# Patient Record
Sex: Male | Born: 1935 | Race: White | Hispanic: No | Smoking: Never smoker
Health system: Southern US, Community
[De-identification: ages and names within clinical notes are randomized; demographics above are authoritative.]

## PROBLEM LIST (undated history)

## (undated) DIAGNOSIS — E785 Hyperlipidemia, unspecified: Secondary | ICD-10-CM

## (undated) DIAGNOSIS — M949 Disorder of cartilage, unspecified: Secondary | ICD-10-CM

## (undated) DIAGNOSIS — R197 Diarrhea, unspecified: Secondary | ICD-10-CM

## (undated) DIAGNOSIS — R4182 Altered mental status, unspecified: Secondary | ICD-10-CM

## (undated) DIAGNOSIS — M2550 Pain in unspecified joint: Secondary | ICD-10-CM

## (undated) DIAGNOSIS — E559 Vitamin D deficiency, unspecified: Secondary | ICD-10-CM

## (undated) DIAGNOSIS — G309 Alzheimer's disease, unspecified: Secondary | ICD-10-CM

## (undated) DIAGNOSIS — R259 Unspecified abnormal involuntary movements: Secondary | ICD-10-CM

## (undated) DIAGNOSIS — M810 Age-related osteoporosis without current pathological fracture: Secondary | ICD-10-CM

## (undated) DIAGNOSIS — E1165 Type 2 diabetes mellitus with hyperglycemia: Secondary | ICD-10-CM

## (undated) DIAGNOSIS — F411 Generalized anxiety disorder: Secondary | ICD-10-CM

## (undated) DIAGNOSIS — R269 Unspecified abnormalities of gait and mobility: Secondary | ICD-10-CM

## (undated) DIAGNOSIS — R42 Dizziness and giddiness: Secondary | ICD-10-CM

## (undated) DIAGNOSIS — H6120 Impacted cerumen, unspecified ear: Secondary | ICD-10-CM

## (undated) DIAGNOSIS — R3129 Other microscopic hematuria: Secondary | ICD-10-CM

## (undated) DIAGNOSIS — R5383 Other fatigue: Secondary | ICD-10-CM

## (undated) DIAGNOSIS — M1990 Unspecified osteoarthritis, unspecified site: Secondary | ICD-10-CM

## (undated) DIAGNOSIS — R350 Frequency of micturition: Secondary | ICD-10-CM

## (undated) DIAGNOSIS — R5381 Other malaise: Secondary | ICD-10-CM

## (undated) DIAGNOSIS — G252 Other specified forms of tremor: Secondary | ICD-10-CM

## (undated) DIAGNOSIS — L2089 Other atopic dermatitis: Secondary | ICD-10-CM

## (undated) DIAGNOSIS — R3915 Urgency of urination: Secondary | ICD-10-CM

## (undated) DIAGNOSIS — K5641 Fecal impaction: Secondary | ICD-10-CM

## (undated) DIAGNOSIS — F0280 Dementia in other diseases classified elsewhere without behavioral disturbance: Secondary | ICD-10-CM

## (undated) DIAGNOSIS — R32 Unspecified urinary incontinence: Secondary | ICD-10-CM

## (undated) DIAGNOSIS — D126 Benign neoplasm of colon, unspecified: Secondary | ICD-10-CM

## (undated) DIAGNOSIS — F3289 Other specified depressive episodes: Secondary | ICD-10-CM

## (undated) DIAGNOSIS — N401 Enlarged prostate with lower urinary tract symptoms: Secondary | ICD-10-CM

## (undated) DIAGNOSIS — IMO0001 Reserved for inherently not codable concepts without codable children: Secondary | ICD-10-CM

## (undated) DIAGNOSIS — G250 Essential tremor: Secondary | ICD-10-CM

## (undated) DIAGNOSIS — Z9181 History of falling: Secondary | ICD-10-CM

## (undated) DIAGNOSIS — G20A1 Parkinson's disease without dyskinesia, without mention of fluctuations: Secondary | ICD-10-CM

## (undated) DIAGNOSIS — G4700 Insomnia, unspecified: Secondary | ICD-10-CM

## (undated) DIAGNOSIS — M25569 Pain in unspecified knee: Secondary | ICD-10-CM

## (undated) DIAGNOSIS — R1310 Dysphagia, unspecified: Secondary | ICD-10-CM

## (undated) DIAGNOSIS — M899 Disorder of bone, unspecified: Secondary | ICD-10-CM

## (undated) DIAGNOSIS — N138 Other obstructive and reflux uropathy: Secondary | ICD-10-CM

## (undated) DIAGNOSIS — G20 Parkinson's disease: Secondary | ICD-10-CM

## (undated) DIAGNOSIS — F329 Major depressive disorder, single episode, unspecified: Secondary | ICD-10-CM

## (undated) DIAGNOSIS — Z125 Encounter for screening for malignant neoplasm of prostate: Secondary | ICD-10-CM

## (undated) DIAGNOSIS — D518 Other vitamin B12 deficiency anemias: Secondary | ICD-10-CM

## (undated) DIAGNOSIS — N23 Unspecified renal colic: Secondary | ICD-10-CM

## (undated) DIAGNOSIS — M069 Rheumatoid arthritis, unspecified: Secondary | ICD-10-CM

## (undated) HISTORY — DX: Other specified depressive episodes: F32.89

## (undated) HISTORY — DX: Other atopic dermatitis: L20.89

## (undated) HISTORY — DX: Diarrhea, unspecified: R19.7

## (undated) HISTORY — DX: Urgency of urination: R39.15

## (undated) HISTORY — DX: Hyperlipidemia, unspecified: E78.5

## (undated) HISTORY — DX: Impacted cerumen, unspecified ear: H61.20

## (undated) HISTORY — PX: APPENDECTOMY: SHX54

## (undated) HISTORY — DX: Benign prostatic hyperplasia with lower urinary tract symptoms: N40.1

## (undated) HISTORY — DX: Disorder of cartilage, unspecified: M94.9

## (undated) HISTORY — DX: History of falling: Z91.81

## (undated) HISTORY — DX: Dizziness and giddiness: R42

## (undated) HISTORY — DX: Encounter for screening for malignant neoplasm of prostate: Z12.5

## (undated) HISTORY — DX: Unspecified abnormalities of gait and mobility: R26.9

## (undated) HISTORY — DX: Other vitamin B12 deficiency anemias: D51.8

## (undated) HISTORY — DX: Other obstructive and reflux uropathy: N13.8

## (undated) HISTORY — DX: Other malaise: R53.81

## (undated) HISTORY — DX: Benign neoplasm of colon, unspecified: D12.6

## (undated) HISTORY — DX: Essential tremor: G25.0

## (undated) HISTORY — DX: Insomnia, unspecified: G47.00

## (undated) HISTORY — DX: Vitamin D deficiency, unspecified: E55.9

## (undated) HISTORY — DX: Unspecified abnormal involuntary movements: R25.9

## (undated) HISTORY — DX: Age-related osteoporosis without current pathological fracture: M81.0

## (undated) HISTORY — DX: Parkinson's disease without dyskinesia, without mention of fluctuations: G20.A1

## (undated) HISTORY — DX: Parkinson's disease: G20

## (undated) HISTORY — DX: Dementia in other diseases classified elsewhere, unspecified severity, without behavioral disturbance, psychotic disturbance, mood disturbance, and anxiety: F02.80

## (undated) HISTORY — DX: Reserved for inherently not codable concepts without codable children: IMO0001

## (undated) HISTORY — DX: Pain in unspecified knee: M25.569

## (undated) HISTORY — DX: Type 2 diabetes mellitus with hyperglycemia: E11.65

## (undated) HISTORY — DX: Other fatigue: R53.83

## (undated) HISTORY — DX: Generalized anxiety disorder: F41.1

## (undated) HISTORY — DX: Other microscopic hematuria: R31.29

## (undated) HISTORY — DX: Fecal impaction: K56.41

## (undated) HISTORY — DX: Disorder of bone, unspecified: M89.9

## (undated) HISTORY — DX: Unspecified urinary incontinence: R32

## (undated) HISTORY — DX: Altered mental status, unspecified: R41.82

## (undated) HISTORY — DX: Pain in unspecified joint: M25.50

## (undated) HISTORY — DX: Unspecified renal colic: N23

## (undated) HISTORY — DX: Essential tremor: G25.2

## (undated) HISTORY — DX: Frequency of micturition: R35.0

## (undated) HISTORY — DX: Dysphagia, unspecified: R13.10

## (undated) HISTORY — DX: Major depressive disorder, single episode, unspecified: F32.9

## (undated) HISTORY — DX: Rheumatoid arthritis, unspecified: M06.9

## (undated) HISTORY — PX: HAND SURGERY: SHX662

## (undated) HISTORY — DX: Alzheimer's disease, unspecified: G30.9

## (undated) HISTORY — DX: Unspecified osteoarthritis, unspecified site: M19.90

---

## 2003-10-12 HISTORY — PX: COLON SURGERY: SHX602

## 2005-08-26 ENCOUNTER — Encounter: Admission: RE | Admit: 2005-08-26 | Discharge: 2005-08-26 | Payer: Self-pay | Admitting: Internal Medicine

## 2005-10-11 HISTORY — PX: EYE SURGERY: SHX253

## 2007-12-22 ENCOUNTER — Ambulatory Visit (HOSPITAL_COMMUNITY): Admission: RE | Admit: 2007-12-22 | Discharge: 2007-12-22 | Payer: Self-pay | Admitting: Internal Medicine

## 2009-11-20 ENCOUNTER — Encounter: Admission: RE | Admit: 2009-11-20 | Discharge: 2010-02-18 | Payer: Self-pay | Admitting: Internal Medicine

## 2010-02-19 ENCOUNTER — Encounter: Admission: RE | Admit: 2010-02-19 | Discharge: 2010-05-20 | Payer: Self-pay | Admitting: Internal Medicine

## 2010-11-01 ENCOUNTER — Encounter: Payer: Self-pay | Admitting: Internal Medicine

## 2012-03-31 ENCOUNTER — Ambulatory Visit
Admission: RE | Admit: 2012-03-31 | Discharge: 2012-03-31 | Disposition: A | Discharge status: Home / Self Care | Payer: Medicare Other | Source: Ambulatory Visit | Attending: Internal Medicine | Admitting: Internal Medicine

## 2012-03-31 ENCOUNTER — Other Ambulatory Visit: Payer: Self-pay | Admitting: Internal Medicine

## 2012-03-31 DIAGNOSIS — K5649 Other impaction of intestine: Secondary | ICD-10-CM

## 2013-01-03 ENCOUNTER — Other Ambulatory Visit: Payer: Self-pay | Admitting: *Deleted

## 2013-01-03 DIAGNOSIS — E119 Type 2 diabetes mellitus without complications: Secondary | ICD-10-CM

## 2013-01-03 DIAGNOSIS — E559 Vitamin D deficiency, unspecified: Secondary | ICD-10-CM

## 2013-01-03 DIAGNOSIS — G20 Parkinson's disease: Secondary | ICD-10-CM

## 2013-02-19 ENCOUNTER — Other Ambulatory Visit: Payer: Self-pay

## 2013-02-23 ENCOUNTER — Other Ambulatory Visit: Payer: Medicare Other

## 2013-02-23 DIAGNOSIS — G20A1 Parkinson's disease without dyskinesia, without mention of fluctuations: Secondary | ICD-10-CM

## 2013-02-23 DIAGNOSIS — G20 Parkinson's disease: Secondary | ICD-10-CM

## 2013-02-23 DIAGNOSIS — E559 Vitamin D deficiency, unspecified: Secondary | ICD-10-CM

## 2013-02-23 DIAGNOSIS — E119 Type 2 diabetes mellitus without complications: Secondary | ICD-10-CM

## 2013-02-24 LAB — COMPREHENSIVE METABOLIC PANEL
ALT: 26 IU/L (ref 0–44)
AST: 17 IU/L (ref 0–40)
Albumin/Globulin Ratio: 1.8 (ref 1.1–2.5)
Albumin: 4.5 g/dL (ref 3.2–4.6)
Alkaline Phosphatase: 92 IU/L (ref 39–117)
BUN/Creatinine Ratio: 18 (ref 10–22)
CO2: 26 mmol/L (ref 19–28)
Calcium: 9.3 mg/dL (ref 8.6–10.2)
Chloride: 100 mmol/L (ref 97–108)
GFR calc Af Amer: 74 mL/min/{1.73_m2} (ref >59)
GFR calc non Af Amer: 64 mL/min/{1.73_m2} (ref >59)
Glucose: 123 mg/dL — ABNORMAL HIGH (ref 65–99)
Potassium: 3.9 mmol/L (ref 3.5–5.2)
Sodium: 138 mmol/L (ref 134–144)
Total Bilirubin: 0.3 mg/dL (ref 0.0–1.2)
Total Protein: 7 g/dL (ref 6.0–8.5)

## 2013-02-24 LAB — LIPID PANEL
Chol/HDL Ratio: 2.3 ratio units (ref 0.0–5.0)
HDL: 66 mg/dL (ref >39)
Triglycerides: 41 mg/dL (ref 0–149)
VLDL Cholesterol Cal: 8 mg/dL (ref 5–40)

## 2013-02-24 LAB — HEMOGLOBIN A1C
Est. average glucose Bld gHb Est-mCnc: 151 mg/dL
Hgb A1c MFr Bld: 6.9 % — ABNORMAL HIGH (ref 4.8–5.6)

## 2013-02-27 ENCOUNTER — Encounter: Payer: Self-pay | Admitting: Geriatric Medicine

## 2013-02-28 ENCOUNTER — Encounter: Payer: Self-pay | Admitting: Internal Medicine

## 2013-02-28 ENCOUNTER — Ambulatory Visit (INDEPENDENT_AMBULATORY_CARE_PROVIDER_SITE_OTHER): Payer: Medicare Other | Admitting: Internal Medicine

## 2013-02-28 VITALS — BP 122/60 | HR 74 | Temp 98.3°F | Resp 14 | Ht 63.0 in | Wt 139.8 lb

## 2013-02-28 DIAGNOSIS — E559 Vitamin D deficiency, unspecified: Secondary | ICD-10-CM

## 2013-02-28 DIAGNOSIS — R269 Unspecified abnormalities of gait and mobility: Secondary | ICD-10-CM | POA: Insufficient documentation

## 2013-02-28 DIAGNOSIS — IMO0001 Reserved for inherently not codable concepts without codable children: Secondary | ICD-10-CM

## 2013-02-28 DIAGNOSIS — R32 Unspecified urinary incontinence: Secondary | ICD-10-CM

## 2013-02-28 DIAGNOSIS — R3915 Urgency of urination: Secondary | ICD-10-CM

## 2013-02-28 DIAGNOSIS — Z125 Encounter for screening for malignant neoplasm of prostate: Secondary | ICD-10-CM

## 2013-02-28 DIAGNOSIS — M069 Rheumatoid arthritis, unspecified: Secondary | ICD-10-CM

## 2013-02-28 DIAGNOSIS — Z0000 Encounter for general adult medical examination without abnormal findings: Secondary | ICD-10-CM

## 2013-02-28 DIAGNOSIS — G20 Parkinson's disease: Secondary | ICD-10-CM | POA: Insufficient documentation

## 2013-02-28 DIAGNOSIS — G20A1 Parkinson's disease without dyskinesia, without mention of fluctuations: Secondary | ICD-10-CM

## 2013-02-28 DIAGNOSIS — E785 Hyperlipidemia, unspecified: Secondary | ICD-10-CM

## 2013-02-28 HISTORY — DX: Urgency of urination: R39.15

## 2013-02-28 HISTORY — DX: Encounter for screening for malignant neoplasm of prostate: Z12.5

## 2013-02-28 NOTE — Progress Notes (Signed)
Date: 02/28/2013  MRN:  [id number] Name:  Martin West Sex:  male Age:  79 y.o. DOB:1917-08-10   [facility] #:[id number]                       Provider: Murray Hodgkins, MD  Emergency Contacts: Contact Information   Name Relation Home Work Mobile   [place] Daughter [phone]  [phone]      Code Status: full  Allergies:No Known Allergies   Chief Complaint  Patient presents with  . Annual Exam     HPI:  Type II or unspecified type diabetes mellitus without mention of complication, uncontrolled: satisfactory control  Abnormality of gait: Continues to be very unstable when walking. Last fall was a few weeks ago. No associated injury.  Unspecified vitamin D deficiency: Continues on vitamin D supplement  Rheumatoid arthritis: No new complaints. Chronic joint problems.  Other and unspecified hyperlipidemia: Controlled  Paralysis agitans: Tremor is present which is most suggestive of Parkinson's disease to me. Benign essential tremor remains a possibility, although I think remote  Unspecified urinary incontinence: Patient is slow to move into the toilet. It takes him quite a while to onset of nonbilious his pannus. Sometimes he is incontinent. He does say that at times he seems to leak and does not even feel the urge to urinate.     Past Medical History  Diagnosis Date  . Fecal impaction   . Hypertrophy of prostate with urinary obstruction and other lower urinary tract symptoms (LUTS)   . Renal colic   . Other atopic dermatitis and related conditions   . Osteoarthrosis, unspecified whether generalized or localized, unspecified site   . Impacted cerumen   . Diarrhea   . Pain in joint, lower leg   . Personal history of fall   . Abnormality of gait   . Anxiety state, unspecified   . Microscopic hematuria   . Altered mental status   . Special screening for malignant neoplasm of prostate   . Type II or unspecified type diabetes mellitus without mention of complication,  uncontrolled   . Unspecified vitamin D deficiency   . Rheumatoid arthritis   . Type II or unspecified type diabetes mellitus without mention of complication, not stated as uncontrolled   . Other and unspecified hyperlipidemia   . Other vitamin B12 deficiency anemia   . Depressive disorder, not elsewhere classified   . Alzheimer's disease   . Paralysis agitans   . Essential and other specified forms of tremor   . Pain in joint, site unspecified   . Disorder of bone and cartilage, unspecified   . Dizziness and giddiness   . Other malaise and fatigue   . Abnormal involuntary movements   . Dysphagia, unspecified   . Urinary frequency   . Insomnia, unspecified   . Osteoporosis, unspecified   . Benign neoplasm of colon     Past Surgical History  Procedure Laterality Date  . Colon surgery  2005    polyp removed  . Appendectomy    . Hand surgery      right hand reconstructive  . Eye surgery  2007    bilateral cataract     Procedures: 2002-Colonoscopy and polypectomy-Dr.Eric Nile Riggs: diminutive polyp at 15cm.;  internal and external hemorrhoids 2005-Colonoscopy-Dr.Eric Nile Riggs: small polyps in the rectum removed with snare and only one was recovered and several were destroyed with cautery; diverticulosis; mild hemorrhoids. 2006 CT brain: atrophy 2006-R Hand X-Ray: Deformity of the third metacarpal head, possibly with  some foreshortening. Changes related to remote trauma are favored and clinical correlation is suggested. No discrete mass lesion is apparent 2007-Bone Density: Osteopenia, Borderline Osteoporosis  2008-Eye Exam-Dr.Hecker: No evidence or diabetic retinopathy or diabetic macular edema 12/22/2007 Fluoroscopy for Swallowing Function Study   Consultants: Eye-Dr.Hecker Neurologist-Dr.Love/Dr.Weymann Urologist-Dr.Marc Nesi Podiatrist-Dr.Martha Ajlouny RheumPollyann Savoy    Current Outpatient Prescriptions  Medication Sig Dispense Refill  . Cholecalciferol  (VITAMIN D) 2000 UNITS tablet Take 2,000 Units by mouth daily.      . hydrocortisone cream 1 % Apply topically daily. Apply daily to scaling areas on face.      . sennosides-docusate sodium (SENOKOT-S) 8.6-50 MG tablet Take 2 - 4 tabs daily for constipation.       No current facility-administered medications for this visit.     There is no immunization history on file for this patient.   Diet: no concentrated sweets  History  Substance Use Topics  . Smoking status: Former Games developer  . Smokeless tobacco: Not on file  . Alcohol Use: No    Family History  Problem Relation Age of Onset  . Diabetes Father   . Alzheimer's disease Father   . Kidney disease Brother   . Diabetes Brother 10    Review of systems Constitutional: positive for fatigue, negative for fevers, night sweats and weight loss Eyes: negative Ears, nose, mouth, throat, and face:  dentures are present and fit well. Respiratory: negative Cardiovascular: negative Gastrointestinal: Chronic mild Dysphagia. He denies coughing at meals. Genitourinary:Occasional incontinence. Increased frequency. Nocturia. Slow and weak urinary stream. Integument/breast: Dry skin Hematologic/lymphatic: negative Musculoskeletal:Back pain and muscular pains. Neurological: Bilateral tremor most suggestive of Parkinson's disease. Difficulty with balance. No headaches or numbness. Some episodes of dizziness. Behavioral/Psych: negative Endocrine:  Diabetic Allergic/Immunologic: negative  Vital signs: BP 122/60  Pulse 74  Temp(Src) 98.3 F (36.8 C) (Oral)  Resp 14  Ht 5\' 3"  (1.6 m)  Wt 139 lb 12.8 oz (63.413 kg)  BMI 24.77 kg/m2  BP 122/60  Pulse 74  Temp(Src) 98.3 F (36.8 C) (Oral)  Resp 14  Ht 5\' 3"  (1.6 m)  Wt 139 lb 12.8 oz (63.413 kg)  BMI 24.77 kg/m2  General Appearance:    Alert, cooperative, no distress, appears stated age  Head:    Normocephalic, without obvious abnormality, atraumatic  Eyes:    PERRL,  conjunctiva/corneas clear, EOM's intact, fundi    benign, both eyes. Lens implants in both eyes.    Ears:    Normal TM's and external ear canals, both ears  Nose:   Nares normal, septum midline, mucosa normal, no drainage   or sinus tenderness  Throat:   Lips, mucosa, and tongue normal;upper and lower dentures.  Neck:   Stiff, trachea midline, no adenopathy;       thyroid:  No enlargement/tenderness/nodules; no carotid   bruit or JVD  Back:     Mild kyphosis, ROM normal, no CVA tenderness  Lungs:     Clear to auscultation bilaterally, respirations unlabored  Chest wall:    No tenderness or deformity  Heart:    Regular rate and rhythm, S1 and S2 normal, no murmur, rub   or gallop  Abdomen:     Soft, non-tender, bowel sounds active all four quadrants,    no masses, no organomegaly  Genitalia:    Normal male without lesion, discharge or tenderness  Rectal:    Normal tone, normal prostate, no masses or tenderness;   guaiac negative stool  Extremities:   Knees  bent with partial flexion contractures, atraumatic, no cyanosis or edema  Pulses:   2+ and symmetric all extremities  Skin:   Skin color, texture, turgor normal, no rashes or lesions. Onychomycosis of both great toes. Left thumbnail has onycholysis.  Lymph nodes:   Cervical, supraclavicular, and axillary nodes normal  Neurologic:   CNII-XII intact. Significant tremor at rest. Generalized weakness. Slow deliberate speech. Positive glabellar response. Drooling.     Screening Score  MMS on 11/15/2007  26/30; failed clock drawing  PHQ2 0  PHQ9     Fall Risk    BIMS    Lab reports 01/29/2009 CBC Wbc 5.0 Rbc 4.58 Hemoglobin 14.1  CMP Glucose 128 Bun 18 Creatinine 0.97  HA1C 7.1  PSA 4.3  Vitamin D Hydroxy 23.7  02/01/2012  CMP: glucose 135, BUN 27, Creatinine 0.90 A1C: 6.9 Lipid: Cholesterol 162, Triglycerides 39, HDL 70, LDL 84 Vitamin D: 40.9 06/01/2012 CMP Glucose 159 Bun 22 Creatinine 0.84  10/20/2012 BMP; Glucose 114, BUN 22,  Creatinine 0.87 A1c 6.5 Microalbumin 32.4 Vit D 15.9 HA1C 6.6 Appointment on 02/23/2013  Component Date Value Range Status  . PSA 02/23/2013 4.4* 0.0 - 4.0 ng/mL Final   Comment: Roche ECLIA methodology.                          According to the American Urological Association, Serum PSA should                          decrease and remain at undetectable levels after radical                          prostatectomy. The AUA defines biochemical recurrence as an initial                          PSA value 0.2 ng/mL or greater followed by a subsequent confirmatory                          PSA value 0.2 ng/mL or greater.                          Values obtained with different assay methods or kits cannot be used                          interchangeably. Results cannot be interpreted as absolute evidence                          of the presence or absence of malignant disease.  Marland Kitchen Hemoglobin A1C 02/23/2013 6.9* 4.8 - 5.6 % Final   Comment:          Increased risk for diabetes: 5.7 - 6.4                                   Diabetes: >6.4                                   Glycemic control for adults with diabetes: <7.0  . Estimated average glucose 02/23/2013 151   Final  . Cholesterol, Total  02/23/2013 151  100 - 199 mg/dL Final  . Triglycerides 02/23/2013 41  0 - 149 mg/dL Final  . HDL 91/47/8295 66  >39 mg/dL Final   Comment: According to ATP-III Guidelines, HDL-C >59 mg/dL is considered a                          negative risk factor for CHD.  Marland Kitchen VLDL Cholesterol Cal 02/23/2013 8  5 - 40 mg/dL Final  . LDL Calculated 02/23/2013 77  0 - 99 mg/dL Final  . Chol/HDL Ratio 02/23/2013 2.3  0.0 - 5.0 ratio units Final  . Glucose 02/23/2013 123* 65 - 99 mg/dL Final  . BUN 62/13/0865 18  10 - 36 mg/dL Final  . Creatinine, Ser 02/23/2013 1.00  0.76 - 1.27 mg/dL Final  . GFR calc non Af Amer 02/23/2013 64  >59 mL/min/1.73 Final  . GFR calc Af Amer 02/23/2013 74  >59 mL/min/1.73 Final  . BUN/Creatinine  Ratio 02/23/2013 18  10 - 22 Final  . Sodium 02/23/2013 138  134 - 144 mmol/L Final  . Potassium 02/23/2013 3.9  3.5 - 5.2 mmol/L Final  . Chloride 02/23/2013 100  97 - 108 mmol/L Final  . CO2 02/23/2013 26  19 - 28 mmol/L Final  . Calcium 02/23/2013 9.3  8.6 - 10.2 mg/dL Final  . Total Protein 02/23/2013 7.0  6.0 - 8.5 g/dL Final  . Albumin 78/46/9629 4.5  3.2 - 4.6 g/dL Final  . Globulin, Total 02/23/2013 2.5  1.5 - 4.5 g/dL Final  . Albumin/Globulin Ratio 02/23/2013 1.8  1.1 - 2.5 Final  . Total Bilirubin 02/23/2013 0.3  0.0 - 1.2 mg/dL Final  . Alkaline Phosphatase 02/23/2013 92  39 - 117 IU/L Final  . AST 02/23/2013 17  0 - 40 IU/L Final  . ALT 02/23/2013 26  0 - 44 IU/L Final  02/28/13 EKG: rate 70. LAD. R-S transition in Vleads displaced to the right. LAFB. LVH with repolarization abn.   Annual summary: Hospitalizations: none in the last year Infection History: none significant Functional assessment: needs help in keeping track of medication, meal preparation. Independent in bathing, dressing, toileting Areas of potential improvement: none Rehabilitation Potential: none Prognosis for survival: fair  Plan: 1. Routine general medical examination at a health care facility - EKG 12-Lead  2. Abnormality of gait High risk for falls  3. Type II or unspecified type diabetes mellitus without mention of complication, uncontrolled Controlled. Diet only. - Basic Metabolic Panel; Future - Hemoglobin A1c; Future  4. Unspecified vitamin D deficiency Recheck next visit - Vitamin D, 1,25-dihydroxy; Future  5. Rheumatoid arthritis No progression. Has seen Dr. Fatima Sanger in the past.  6. Other and unspecified hyperlipidemia Controlled.  7. Paralysis agitans unchanged  8. Unspecified urinary incontinence May be associated with slow movements due to Parkinson's.  9. Memory deficit Unchanged. I am unable to say whether this is related to Parkinsonism, Alzheimer's, or due to  vascular issues.

## 2013-02-28 NOTE — Patient Instructions (Signed)
Continue current medications. 

## 2013-03-12 ENCOUNTER — Other Ambulatory Visit: Payer: Self-pay | Admitting: *Deleted

## 2013-03-12 ENCOUNTER — Other Ambulatory Visit: Payer: Self-pay | Admitting: Internal Medicine

## 2013-03-12 MED ORDER — GLUCOSE BLOOD VI STRP: ORAL_STRIP | Status: DC

## 2013-03-12 NOTE — Addendum Note (Signed)
Addended by: Buena Irish A on: 03/12/2013 04:37 PM   Modules accepted: Orders

## 2013-03-15 ENCOUNTER — Encounter: Payer: Self-pay | Admitting: Internal Medicine

## 2013-03-22 ENCOUNTER — Other Ambulatory Visit: Payer: Self-pay | Admitting: Internal Medicine

## 2013-03-23 ENCOUNTER — Ambulatory Visit (INDEPENDENT_AMBULATORY_CARE_PROVIDER_SITE_OTHER): Payer: Medicare Other | Admitting: Internal Medicine

## 2013-03-23 ENCOUNTER — Ambulatory Visit
Admission: RE | Admit: 2013-03-23 | Discharge: 2013-03-23 | Disposition: A | Discharge status: Home / Self Care | Payer: Medicare Other | Source: Ambulatory Visit | Attending: Internal Medicine | Admitting: Internal Medicine

## 2013-03-23 ENCOUNTER — Encounter: Payer: Self-pay | Admitting: Internal Medicine

## 2013-03-23 VITALS — BP 122/68 | HR 98 | Temp 98.1°F | Resp 12 | Ht 63.0 in | Wt 137.0 lb

## 2013-03-23 DIAGNOSIS — R197 Diarrhea, unspecified: Secondary | ICD-10-CM

## 2013-03-23 MED ORDER — FREESTYLE LANCETS MISC: Status: DC

## 2013-03-23 NOTE — Progress Notes (Signed)
Patient ID: Martin West, male   DOB: June 22, 1917, 79 y.o.   MRN: [id number]  No Known Allergies  Chief Complaint  Patient presents with  . Acute Visit    fluid coming out of rectum for 2 days. Stomach pain once in a while    HPI: Patient is a 79 y.o. white male seen in the office today for "water coming out of his rectum".  Started a couple of days ago.  Belly hurts when has to go only.  Last regular BM was 03/19/13 an 6/4 before that.  Nurse keeps a record of this.  Pt calls them blowouts which started tues 6/10.  When he woke up this am, he told his daughter he feels lousy, was pale at first sight, looked drained.  Not painful as "water comes out".  No blood.  2x this am, 2-3x yesterday water coming out of rectum.  No fever, no chills.  Appetite is good.  He is still taking his  Senna-s daily.    Overall, there is a lot of uncertainty about his bowel pattern at this time.    Review of Systems:  Review of Systems  Constitutional: Negative for fever, chills and malaise/fatigue.  Gastrointestinal: Positive for abdominal pain and diarrhea. Negative for heartburn, nausea, vomiting, constipation, blood in stool and melena.       See hpi  Genitourinary: Negative for dysuria and frequency.  Skin: Negative for rash.  Neurological: Positive for dizziness and tremors. Negative for weakness.       Advanced parkinson's disease  Psychiatric/Behavioral: Positive for memory loss.    Past Medical History  Diagnosis Date  . Fecal impaction(560.32)   . Hypertrophy of prostate with urinary obstruction and other lower urinary tract symptoms (LUTS)   . Renal colic   . Other atopic dermatitis and related conditions   . Osteoarthrosis, unspecified whether generalized or localized, unspecified site   . Impacted cerumen   . Diarrhea   . Pain in joint, lower leg   . Personal history of fall   . Abnormality of gait   . Anxiety state, unspecified   . Microscopic hematuria   . Altered mental status   .  Special screening for malignant neoplasm of prostate   . Type II or unspecified type diabetes mellitus without mention of complication, uncontrolled   . Unspecified vitamin D deficiency   . Rheumatoid arthritis(714.0)   . Other and unspecified hyperlipidemia   . Other vitamin B12 deficiency anemia   . Depressive disorder, not elsewhere classified   . Alzheimer's disease   . Paralysis agitans   . Essential and other specified forms of tremor   . Pain in joint, site unspecified   . Disorder of bone and cartilage, unspecified   . Dizziness and giddiness   . Other malaise and fatigue   . Abnormal involuntary movements(781.0)   . Dysphagia, unspecified(787.20)   . Urinary frequency   . Insomnia, unspecified   . Osteoporosis, unspecified   . Benign neoplasm of colon   . Unspecified urinary incontinence   . Urgency of urination 02/28/13  . Special screening for malignant neoplasm of prostate 02/28/13   Past Surgical History  Procedure Laterality Date  . Colon surgery  2005    polyp removed  . Appendectomy    . Hand surgery      right hand reconstructive  . Eye surgery  2007    bilateral cataract   Social History:   reports that he has quit smoking. He does  not have any smokeless tobacco history on file. He reports that he does not drink alcohol or use illicit drugs.  Family History  Problem Relation Age of Onset  . Diabetes Father   . Alzheimer's disease Father   . Kidney disease Brother   . Diabetes Brother 24    Medications: Patient's Medications  New Prescriptions   No medications on file  Previous Medications   CHOLECALCIFEROL (VITAMIN D) 2000 UNITS TABLET    Take 2,000 Units by mouth daily.   GLUCOSE BLOOD (FREESTYLE TEST STRIPS) TEST STRIP    Test Blood sugar twice daily. Dx: 250.02   HYDROCORTISONE CREAM 1 %    Apply topically daily. Apply daily to scaling areas on face.   SENNOSIDES-DOCUSATE SODIUM (SENOKOT-S) 8.6-50 MG TABLET    Take 2 - 4 tabs daily for  constipation.  Modified Medications   Modified Medication Previous Medication   LANCETS (FREESTYLE) LANCETS Lancets (FREESTYLE) lancets      Use once daily to help control blood sugar. 250.00    use as directed by prescriber  Discontinued Medications   No medications on file   Physical Exam:  Filed Vitals:   03/23/13 1103  BP: 122/68  Pulse: 98  Temp: 98.1 F (36.7 C)  TempSrc: Oral  Resp: 12  Height: 5\' 3"  (1.6 m)  Weight: 137 lb (62.143 kg)  SpO2: 94%  Physical Exam  Constitutional:  Frail caucasian male in NAD, ambulates with cane unsteadily, has severe parkinsonian tremor, is pleasant and tries to answer questions  Cardiovascular: Normal rate, regular rhythm, normal heart sounds and intact distal pulses.   Pulmonary/Chest: Effort normal and breath sounds normal. No respiratory distress.  Abdominal: Soft. Bowel sounds are normal. He exhibits no distension and no mass. There is no tenderness. There is no rebound and no guarding. No hernia.  Rectal exam had minimal specks of stool, otherwise exam finger clean, no blood, hemoccult negative, no significant stool in rectal vault  Neurological: He is alert.  Skin:  Mild erythema surround anus;  Depends w/o stool present    Labs reviewed: Basic Metabolic Panel:  Recent Labs  40/98/11 0912  NA 138  K 3.9  CL 100  CO2 26  GLUCOSE 123*  BUN 18  CREATININE 1.00  CALCIUM 9.3   Liver Function Tests:  Recent Labs  02/23/13 0912  AST 17  ALT 26  ALKPHOS 92  BILITOT 0.3  PROT 7.0  Lipid Panel:  Recent Labs  02/23/13 0912  HDL 66  LDLCALC 77  TRIG 41  CHOLHDL 2.3   Assessment/Plan  1. Watery diarrhea --no evidence of impaction/obstruction on exam, but could be up higher in bowel --suspect mild viral enteritis as etiology, but unclear due to poor history --R/o obstruction with KUB- DG Abd 1 View; Future--for today - CBC with Differential, CMP, Amylase, Lipase to r/o pancreatitis and be sure he does not have  leukocytosis or dehydration due to acute renal failure

## 2013-03-23 NOTE — Patient Instructions (Addendum)
Will get an xray of your belly and labwork.

## 2013-03-24 LAB — COMPREHENSIVE METABOLIC PANEL
ALT: 17 IU/L (ref 0–44)
AST: 14 IU/L (ref 0–40)
Albumin/Globulin Ratio: 2 (ref 1.1–2.5)
Albumin: 4.6 g/dL (ref 3.2–4.6)
Alkaline Phosphatase: 76 IU/L (ref 39–117)
BUN/Creatinine Ratio: 29 — ABNORMAL HIGH (ref 10–22)
BUN: 27 mg/dL (ref 10–36)
CO2: 25 mmol/L (ref 19–28)
Calcium: 9.6 mg/dL (ref 8.6–10.2)
Chloride: 101 mmol/L (ref 97–108)
Creatinine, Ser: 0.93 mg/dL (ref 0.76–1.27)
GFR calc Af Amer: 81 mL/min/{1.73_m2} (ref >59)
GFR calc non Af Amer: 70 mL/min/{1.73_m2} (ref >59)
Globulin, Total: 2.3 g/dL (ref 1.5–4.5)
Glucose: 109 mg/dL — ABNORMAL HIGH (ref 65–99)
Potassium: 4 mmol/L (ref 3.5–5.2)
Sodium: 141 mmol/L (ref 134–144)
Total Bilirubin: 0.3 mg/dL (ref 0.0–1.2)
Total Protein: 6.9 g/dL (ref 6.0–8.5)

## 2013-03-24 LAB — CBC WITH DIFFERENTIAL/PLATELET
Basophils Absolute: 0 10*3/uL (ref 0.0–0.2)
Basos: 0 % (ref 0–3)
Eos: 2 % (ref 0–5)
Eosinophils Absolute: 0.1 10*3/uL (ref 0.0–0.4)
HCT: 38.3 % (ref 37.5–51.0)
Hemoglobin: 12.9 g/dL (ref 12.6–17.7)
Immature Grans (Abs): 0 10*3/uL (ref 0.0–0.1)
Immature Granulocytes: 0 % (ref 0–2)
Lymphocytes Absolute: 1 10*3/uL (ref 0.7–3.1)
Lymphs: 22 % (ref 14–46)
MCH: 30.6 pg (ref 26.6–33.0)
MCHC: 33.7 g/dL (ref 31.5–35.7)
MCV: 91 fL (ref 79–97)
Monocytes Absolute: 0.3 10*3/uL (ref 0.1–0.9)
Monocytes: 7 % (ref 4–12)
Neutrophils Absolute: 3.2 10*3/uL (ref 1.4–7.0)
Neutrophils Relative %: 69 % (ref 40–74)
RBC: 4.21 x10E6/uL (ref 4.14–5.80)
RDW: 14.2 % (ref 12.3–15.4)
WBC: 4.6 10*3/uL (ref 3.4–10.8)

## 2013-03-24 LAB — AMYLASE: Amylase: 117 U/L (ref 31–124)

## 2013-03-24 LAB — LIPASE: Lipase: 15 U/L (ref 0–59)

## 2013-04-16 ENCOUNTER — Telehealth: Payer: Self-pay | Admitting: Geriatric Medicine

## 2013-04-16 ENCOUNTER — Encounter: Payer: Self-pay | Admitting: Internal Medicine

## 2013-04-16 ENCOUNTER — Ambulatory Visit (INDEPENDENT_AMBULATORY_CARE_PROVIDER_SITE_OTHER): Payer: Medicare Other | Admitting: Internal Medicine

## 2013-04-16 VITALS — BP 124/82 | HR 88 | Temp 97.8°F | Resp 13 | Ht 63.0 in | Wt 136.2 lb

## 2013-04-16 DIAGNOSIS — R0789 Other chest pain: Secondary | ICD-10-CM

## 2013-04-16 DIAGNOSIS — L24A2 Irritant contact dermatitis due to fecal, urinary or dual incontinence: Secondary | ICD-10-CM

## 2013-04-16 DIAGNOSIS — L22 Diaper dermatitis: Secondary | ICD-10-CM

## 2013-04-16 DIAGNOSIS — R071 Chest pain on breathing: Secondary | ICD-10-CM

## 2013-04-16 NOTE — Patient Instructions (Signed)
Sheri,   It appears that Pop has contact dermatitis of his buttocks and scrotum from urinary incontinence (wetting himself).  I recommend using aloe vesta (or other brand) barrier cream to protect his skin after he is cleaned up from a BM or urinary incontinence episode.  The tushi wipes are ok, and the honey salve is ok to use over the open places on his bottom.  I believe his right sided chest pain is due to his fall--it has probably hurt his ribs on that side and that is why it hurts him to cough or take big breaths.  This should improve with time.

## 2013-04-16 NOTE — Progress Notes (Signed)
Patient ID: Martin West, male   DOB: October 31, 1916, 79 y.o.   MRN: [id number] Location:  [facility]  No Known Allergies  Chief Complaint  Patient presents with  . Fall    on Friday  . Sore    sore on bottom    HPI: Patient is a 79 y.o.  seen in the office today for acute  Visit due to fall and sore on bottom.  He is accompanied by his caregiver, Isabell Jarvis.   Has sore on his bottom Had fall on Friday, 7/4.  Fell from picnic bench when lost balance while sitting down.  Landed on his back.   Hurts to cough and take deep breaths.    Review of Systems:  Review of Systems  Constitutional: Positive for malaise/fatigue. Negative for fever and chills.  Eyes: Negative for blurred vision.  Respiratory: Negative for shortness of breath.   Cardiovascular: Negative for chest pain.  Gastrointestinal: Positive for constipation. Negative for abdominal pain.  Genitourinary: Negative for dysuria.  Musculoskeletal: Positive for back pain.  Skin:       Sore on bottom  Neurological: Positive for tremors and weakness. Negative for loss of consciousness and headaches.       Parkinson's disease  Endo/Heme/Allergies: Bruises/bleeds easily.  Psychiatric/Behavioral: Positive for depression and memory loss.    Past Medical History  Diagnosis Date  . Fecal impaction(560.32)   . Hypertrophy of prostate with urinary obstruction and other lower urinary tract symptoms (LUTS)   . Renal colic   . Other atopic dermatitis and related conditions   . Osteoarthrosis, unspecified whether generalized or localized, unspecified site   . Impacted cerumen   . Diarrhea   . Pain in joint, lower leg   . Personal history of fall   . Abnormality of gait   . Anxiety state, unspecified   . Microscopic hematuria   . Altered mental status   . Special screening for malignant neoplasm of prostate   . Type II or unspecified type diabetes mellitus without mention of  complication, uncontrolled   . Unspecified vitamin D deficiency   . Rheumatoid arthritis(714.0)   . Other and unspecified hyperlipidemia   . Other vitamin B12 deficiency anemia   . Depressive disorder, not elsewhere classified   . Alzheimer's disease   . Paralysis agitans   . Essential and other specified forms of tremor   . Pain in joint, site unspecified   . Disorder of bone and cartilage, unspecified   . Dizziness and giddiness   . Other malaise and fatigue   . Abnormal involuntary movements(781.0)   . Dysphagia, unspecified(787.20)   . Urinary frequency   . Insomnia, unspecified   . Osteoporosis, unspecified   . Benign neoplasm of colon   . Unspecified urinary incontinence   . Urgency of urination 02/28/13  . Special screening for malignant neoplasm of prostate 02/28/13    Past Surgical History  Procedure Laterality Date  . Colon surgery  2005    polyp removed  . Appendectomy    . Hand surgery      right hand reconstructive  . Eye surgery  2007    bilateral cataract    Social History:   reports that he has never smoked. He does not have any smokeless tobacco history on file. He reports that he does not drink alcohol or use illicit drugs.  Family History  Problem Relation Age of Onset  . Diabetes Father   . Alzheimer's disease  Father   . Kidney disease Brother   . Diabetes Brother 63    Medications: Patient's Medications  New Prescriptions   No medications on file  Previous Medications   CHOLECALCIFEROL (VITAMIN D) 2000 UNITS TABLET    Take 2,000 Units by mouth daily.   GLUCOSE BLOOD (FREESTYLE TEST STRIPS) TEST STRIP    Test Blood sugar twice daily. Dx: 250.02   HYDROCORTISONE CREAM 1 %    Apply topically daily. Apply daily to scaling areas on face.   LANCETS (FREESTYLE) LANCETS    Use once daily to help control blood sugar. 250.00   SENNOSIDES-DOCUSATE SODIUM (SENOKOT-S) 8.6-50 MG TABLET    Take 2 - 4 tabs daily for constipation.  Modified Medications   No  medications on file  Discontinued Medications   No medications on file     Physical Exam: Filed Vitals:   04/16/13 1556  BP: 124/82  Pulse: 88  Temp: 97.8 F (36.6 C)  TempSrc: Oral  Resp: 13  Height: 5\' 3"  (1.6 m)  Weight: 136 lb 3.2 oz (61.78 kg)  SpO2: 97%  Physical Exam  Constitutional:  Frail white male, requires at least one person assist for transfers and repositioning  HENT:  Head: Normocephalic and atraumatic.  Cardiovascular: Normal rate, regular rhythm, normal heart sounds and intact distal pulses.   Pulmonary/Chest: Effort normal and breath sounds normal. No respiratory distress.  Abdominal: Soft. Bowel sounds are normal. He exhibits no distension. There is no tenderness.  Musculoskeletal: He exhibits tenderness.  Over lower back   Neurological: He is alert.     Labs reviewed: Basic Metabolic Panel:  Recent Labs  16/10/96 0912 03/23/13 1158  NA 138 141  K 3.9 4.0  CL 100 101  CO2 26 25  GLUCOSE 123* 109*  BUN 18 27  CREATININE 1.00 0.93  CALCIUM 9.3 9.6   Liver Function Tests:  Recent Labs  02/23/13 0912 03/23/13 1158  AST 17 14  ALT 26 17  ALKPHOS 92 76  BILITOT 0.3 0.3  PROT 7.0 6.9    Recent Labs  03/23/13 1158  LIPASE 15  AMYLASE 117     Recent Labs  03/23/13 1158  WBC 4.6  NEUTROABS 3.2  HGB 12.9  HCT 38.3  MCV 91   Lipid Panel:  Recent Labs  02/23/13 0912  HDL 66  LDLCALC 77  TRIG 41  CHOLHDL 2.3   Lab Results  Component Value Date   HGBA1C 6.9* 02/23/2013   Assessment/Plan 1. Urine induced contact dermatitis -advised to keep clean and dry -apply a barrier cream to prevent irritation -reposition at least every 2 hours to prevent skin breakdown  2. Right-sided chest wall pain -suspect rib fracture during fall -advised that rib fx would need to heal on own -advised use of tylenol for pain and topical otc agents  Next appt:  Keep as scheduled with Dr. Chilton Si

## 2013-04-25 ENCOUNTER — Ambulatory Visit: Payer: Medicare Other | Admitting: Internal Medicine

## 2013-04-30 ENCOUNTER — Ambulatory Visit: Payer: Medicare Other | Admitting: Internal Medicine

## 2013-05-16 ENCOUNTER — Other Ambulatory Visit: Payer: Self-pay

## 2013-06-29 ENCOUNTER — Other Ambulatory Visit: Payer: Medicare Other

## 2013-06-30 LAB — HEMOGLOBIN A1C
Est. average glucose Bld gHb Est-mCnc: 137 mg/dL
Hgb A1c MFr Bld: 6.4 % — ABNORMAL HIGH (ref 4.8–5.6)

## 2013-06-30 LAB — BASIC METABOLIC PANEL
BUN/Creatinine Ratio: 23 — ABNORMAL HIGH (ref 10–22)
CO2: 27 mmol/L (ref 18–29)
Calcium: 9.3 mg/dL (ref 8.6–10.2)
Creatinine, Ser: 0.86 mg/dL (ref 0.76–1.27)
Potassium: 3.9 mmol/L (ref 3.5–5.2)
Sodium: 141 mmol/L (ref 134–144)

## 2013-07-04 ENCOUNTER — Ambulatory Visit (INDEPENDENT_AMBULATORY_CARE_PROVIDER_SITE_OTHER): Payer: Medicare Other | Admitting: Internal Medicine

## 2013-07-04 ENCOUNTER — Encounter: Payer: Self-pay | Admitting: Internal Medicine

## 2013-07-04 VITALS — BP 140/80 | HR 72 | Temp 97.8°F | Resp 18 | Ht 63.0 in | Wt 129.2 lb

## 2013-07-04 DIAGNOSIS — R269 Unspecified abnormalities of gait and mobility: Secondary | ICD-10-CM

## 2013-07-04 DIAGNOSIS — G20 Parkinson's disease: Secondary | ICD-10-CM

## 2013-07-04 DIAGNOSIS — E785 Hyperlipidemia, unspecified: Secondary | ICD-10-CM

## 2013-07-04 DIAGNOSIS — Z23 Encounter for immunization: Secondary | ICD-10-CM

## 2013-07-04 NOTE — Patient Instructions (Addendum)
Continue exercises.  Be careful to use pillow or cushion when you sit. Sitting in a more upright position helps reduce pressure on the sacrum.

## 2013-07-04 NOTE — Progress Notes (Signed)
Subjective:    Patient ID: Martin West, male    DOB: [birth year], 79 y.o.   MRN: [id number]  HPI Type II or unspecified type diabetes mellitus without mention of complication, uncontrolled: currently under good control  Abnormality of gait: unchanged. High risk for falls.  Other and unspecified hyperlipidemia: controlled  Paralysis agitans: unchanged  Sore at coccyx after he fell. Previously had a sore. Better now.  Daughter wants a prescription for massage therapy.  Needs a prescription for a lift chair.  Hearing worse. History of cerumen impactions.    Current Outpatient Prescriptions on File Prior to Visit  Medication Sig Dispense Refill  . Cholecalciferol (VITAMIN D) 2000 UNITS tablet Take 2,000 Units by mouth daily.      Marland Kitchen glucose blood (FREESTYLE TEST STRIPS) test strip Test Blood sugar twice daily. Dx: 250.02  100 each  12  . hydrocortisone cream 1 % Apply topically daily. Apply daily to scaling areas on face.      . Lancets (FREESTYLE) lancets Use once daily to help control blood sugar. 250.00  100 each  3   No current facility-administered medications on file prior to visit.    Review of Systems  Constitutional: Negative for fever, chills, diaphoresis, activity change, appetite change, fatigue and unexpected weight change.  HENT: Positive for dental problem (Wears dentures) and hearing loss.   Eyes: Negative.   Respiratory: Negative.   Cardiovascular: Negative for chest pain, palpitations and leg swelling.  Gastrointestinal:       Mild dysphagia  Endocrine:       History of diabetes. Currently diet controlled.  Genitourinary: Positive for frequency.       Nocturia x 4. Slow, weak urinary stream. History of prostate enlargement.  Musculoskeletal: Positive for arthralgias, back pain and gait problem (Unstable. Tendency for retropulsion.).  Allergic/Immunologic: Negative.   Neurological: Positive for dizziness and tremors.       Parkinson features. Increased  rigidity. Retropulsion. Negative glabellar response. Mild increased rigidity. Tremor at rest.  Hematological: Negative.   Psychiatric/Behavioral: Negative.        Objective:BP 140/80  Pulse 72  Temp(Src) 97.8 F (36.6 C) (Oral)  Resp 18  Ht 5\' 3"  (1.6 m)  Wt 129 lb 3.2 oz (58.605 kg)  BMI 22.89 kg/m2    Physical Exam  Constitutional: He is oriented to person, place, and time. No distress.  Frail, elderly male.  HENT:  Head: Normocephalic and atraumatic.  Right Ear: External ear normal.  Left Ear: External ear normal.  Nose: Nose normal.  Mouth/Throat: Oropharynx is clear and moist.  Upper and lower dentures  Eyes: Conjunctivae and EOM are normal. Pupils are equal, round, and reactive to light.  Neck: No JVD present. No tracheal deviation present. No thyromegaly present.  Cardiovascular: Normal rate, regular rhythm and normal heart sounds.  Exam reveals no friction rub.   No murmur heard. Pulmonary/Chest: No respiratory distress. He has no wheezes. He has no rales.  Abdominal: He exhibits no distension and no mass. There is no tenderness.  Musculoskeletal: He exhibits no edema and no tenderness.  Lymphadenopathy:    He has no cervical adenopathy.  Neurological: He is alert and oriented to person, place, and time. No cranial nerve deficit. Coordination abnormal.  11/14/1998 9 MMSE 26/30. Failed clock drawing. None the feet. Loss of vibratory sensation bilaterally.  Skin: No rash noted. No erythema. No pallor.  Psychiatric: He has a normal mood and affect. His behavior is normal. Thought content normal.  Appointment on 06/29/2013  Component Date Value Range Status  . Glucose 06/29/2013 106* 65 - 99 mg/dL Final  . BUN 13/05/6577 20  10 - 36 mg/dL Final  . Creatinine, Ser 06/29/2013 0.86  0.76 - 1.27 mg/dL Final  . GFR calc non Af Amer 06/29/2013 74  >59 mL/min/1.73 Final  . GFR calc Af Amer 06/29/2013 86  >59 mL/min/1.73 Final  . BUN/Creatinine Ratio 06/29/2013 23* 10 -  22 Final  . Sodium 06/29/2013 141  134 - 144 mmol/L Final  . Potassium 06/29/2013 3.9  3.5 - 5.2 mmol/L Final  . Chloride 06/29/2013 100  97 - 108 mmol/L Final  . CO2 06/29/2013 27  18 - 29 mmol/L Final  . Calcium 06/29/2013 9.3  8.6 - 10.2 mg/dL Final  . Hemoglobin I6N 06/29/2013 6.4* 4.8 - 5.6 % Final   Comment:          Increased risk for diabetes: 5.7 - 6.4                                   Diabetes: >6.4                                   Glycemic control for adults with diabetes: <7.0  . Estimated average glucose 06/29/2013 137   Final        Assessment & Plan:  1. Abnormality of gait Assessment uterine Parkinson's disease. Denies cough. Recommended that he walker, but patient refuses to use. Recommended he continue exercises at gymnasium.  2. Type II or unspecified type diabetes mellitus without mention of complication, uncontrolled Controlled - Comprehensive metabolic panel; Future - Hemoglobin A1c; Future  3. Other and unspecified hyperlipidemia Recheck at future visit - Lipid panel; Future  4. Paralysis agitans Unchanged. Previous fall risk.  5. Need for prophylactic vaccination and inoculation against influenza Administered.

## 2013-10-22 DIAGNOSIS — R1310 Dysphagia, unspecified: Secondary | ICD-10-CM | POA: Insufficient documentation

## 2013-11-07 ENCOUNTER — Encounter: Payer: Self-pay | Admitting: Nurse Practitioner

## 2013-11-07 ENCOUNTER — Encounter: Payer: Self-pay | Admitting: Internal Medicine

## 2013-11-07 ENCOUNTER — Other Ambulatory Visit: Payer: Medicare Other

## 2013-11-07 ENCOUNTER — Ambulatory Visit (INDEPENDENT_AMBULATORY_CARE_PROVIDER_SITE_OTHER): Payer: Medicare Other | Admitting: Nurse Practitioner

## 2013-11-07 ENCOUNTER — Ambulatory Visit
Admission: RE | Admit: 2013-11-07 | Discharge: 2013-11-07 | Disposition: A | Discharge status: Home / Self Care | Payer: Medicare Other | Source: Ambulatory Visit | Attending: Nurse Practitioner | Admitting: Nurse Practitioner

## 2013-11-07 VITALS — BP 144/70 | HR 86 | Temp 99.9°F | Resp 16 | Wt 127.0 lb

## 2013-11-07 DIAGNOSIS — IMO0001 Reserved for inherently not codable concepts without codable children: Secondary | ICD-10-CM

## 2013-11-07 DIAGNOSIS — E1165 Type 2 diabetes mellitus with hyperglycemia: Secondary | ICD-10-CM

## 2013-11-07 DIAGNOSIS — J069 Acute upper respiratory infection, unspecified: Secondary | ICD-10-CM

## 2013-11-07 DIAGNOSIS — E785 Hyperlipidemia, unspecified: Secondary | ICD-10-CM

## 2013-11-07 MED ORDER — DOXYCYCLINE HYCLATE 100 MG PO TABS: 100.0000 mg | ORAL_TABLET | Freq: Two times a day (BID) | ORAL | Status: DC

## 2013-11-07 NOTE — Progress Notes (Signed)
Patient ID: Martin West, male   DOB: 05-11-1917, 80 y.o.   MRN: [id number]    No Known Allergies  Chief Complaint  Patient presents with  . Acute Visit    chest congestion, cough w/ no phlegm movement, low grade fever x 2 days.  has used [employer] Cold tablets with some relief & pm medication as well.  pt fell in bathroom hit his head, has a bruise on LT side of head and RT shin x 2 days  . other    needs orders for Vitamin B 12 & urine sample today     HPI: Patient is a 80 y.o. male seen in the office today for cold with congestion; low grade temp today, at home he has been about the same for 2 days; has lost appetite but it has improved, decrease energy and had a fall, no major injuries; no changes in mental status. No shortness of breath, chest congestion but no nasal congestion, no sore throat, no chest pain, no fullness in ears or headaches  Has used alka seltzer cold tablets  Family got sick after going to a funeral; no high fevers Overall getting better Review of Systems:  Review of Systems  Constitutional: Positive for fever (low grade). Negative for malaise/fatigue.  HENT: Negative for congestion, ear pain and sore throat.   Respiratory: Positive for cough and sputum production. Negative for shortness of breath.   Cardiovascular: Negative for chest pain and palpitations.  Gastrointestinal: Negative for diarrhea and constipation.  Genitourinary: Positive for frequency. Negative for dysuria and urgency.  Musculoskeletal: Negative for myalgias.  Neurological: Positive for tremors and weakness. Negative for headaches.     Past Medical History  Diagnosis Date  . Fecal impaction(560.32)   . Hypertrophy of prostate with urinary obstruction and other lower urinary tract symptoms (LUTS)   . Renal colic   . Other atopic dermatitis and related conditions   . Osteoarthrosis, unspecified whether generalized or localized, unspecified site   . Impacted cerumen   . Diarrhea   .  Pain in joint, lower leg   . Personal history of fall   . Abnormality of gait   . Anxiety state, unspecified   . Microscopic hematuria   . Altered mental status   . Special screening for malignant neoplasm of prostate   . Type II or unspecified type diabetes mellitus without mention of complication, uncontrolled   . Unspecified vitamin D deficiency   . Rheumatoid arthritis(714.0)   . Other and unspecified hyperlipidemia   . Other vitamin B12 deficiency anemia   . Depressive disorder, not elsewhere classified   . Alzheimer's disease   . Paralysis agitans   . Essential and other specified forms of tremor   . Pain in joint, site unspecified   . Disorder of bone and cartilage, unspecified   . Dizziness and giddiness   . Other malaise and fatigue   . Abnormal involuntary movements(781.0)   . Dysphagia, unspecified(787.20)   . Urinary frequency   . Insomnia, unspecified   . Osteoporosis, unspecified   . Benign neoplasm of colon   . Unspecified urinary incontinence   . Urgency of urination 02/28/13  . Special screening for malignant neoplasm of prostate 02/28/13   Past Surgical History  Procedure Laterality Date  . Colon surgery  2005    polyp removed  . Appendectomy    . Hand surgery      right hand reconstructive  . Eye surgery  2007    bilateral cataract  Social History:   reports that he has never smoked. He does not have any smokeless tobacco history on file. He reports that he does not drink alcohol or use illicit drugs.  Family History  Problem Relation Age of Onset  . Diabetes Father   . Alzheimer's disease Father   . Kidney disease Brother   . Diabetes Brother 22    Medications: Patient's Medications  New Prescriptions   No medications on file  Previous Medications   CHOLECALCIFEROL (VITAMIN D) 2000 UNITS TABLET    Take 2,000 Units by mouth daily.   GLUCOSE BLOOD (FREESTYLE TEST STRIPS) TEST STRIP    Test Blood sugar twice daily. Dx: 250.02   HYDROCORTISONE  CREAM 1 %    Apply topically daily. Apply daily to scaling areas on face.   LANCETS (FREESTYLE) LANCETS    Use once daily to help control blood sugar. 250.00  Modified Medications   No medications on file  Discontinued Medications   No medications on file     Physical Exam:  Filed Vitals:   11/07/13 1539  BP: 144/70  Pulse: 86  Temp: 99.9 F (37.7 C)  TempSrc: Oral  Resp: 16  Weight: 127 lb (57.607 kg)  SpO2: 95%    Physical Exam  Constitutional:  Frail male in wheelchair; pt in NAD  HENT:  Mouth/Throat: Oropharynx is clear and moist. No oropharyngeal exudate.  Eyes: Conjunctivae and EOM are normal. Pupils are equal, round, and reactive to light.  Neck: Normal range of motion. Neck supple.  Cardiovascular: Normal rate, regular rhythm and normal heart sounds.   Pulmonary/Chest: Effort normal and breath sounds normal. No respiratory distress.  Diminished breath sounds  Abdominal: Soft. Bowel sounds are normal. He exhibits no distension.  Lymphadenopathy:    He has no cervical adenopathy.  Neurological: He is alert.  Skin: Skin is warm and dry.     Labs reviewed: Basic Metabolic Panel:  Recent Labs  02/23/13 0912 03/23/13 1158 06/29/13 0838  NA 138 141 141  K 3.9 4.0 3.9  CL 100 101 100  CO2 26 25 27   GLUCOSE 123* 109* 106*  BUN 18 27 20   CREATININE 1.00 0.93 0.86  CALCIUM 9.3 9.6 9.3   Liver Function Tests:  Recent Labs  02/23/13 0912 03/23/13 1158  AST 17 14  ALT 26 17  ALKPHOS 92 76  BILITOT 0.3 0.3  PROT 7.0 6.9    Recent Labs  03/23/13 1158  LIPASE 15  AMYLASE 117   No results found for this basename: AMMONIA,  in the last 8760 hours CBC:  Recent Labs  03/23/13 1158  WBC 4.6  NEUTROABS 3.2  HGB 12.9  HCT 38.3  MCV 91   Lipid Panel:  Recent Labs  02/23/13 0912  HDL 66  LDLCALC 77  TRIG 41  CHOLHDL 2.3   TSH: No results found for this basename: TSH,  in the last 8760 hours A1C: No components found with this  basename: A1C,    Assessment/Plan 1. Respiratory infection with cough and congestion -cont supportive care; increase hydration, good PO intake - mucinex DM BID for 1 week - CBC With differential/Platelet - DG Chest 2 View r/o pna - doxycycline (VIBRA-TABS) 100 MG tablet; Take 1 tablet (100 mg total) by mouth 2 (two) times daily.  Dispense: 20 tablet; Refill: 0 - will not treat with tamiflu (flu is in the differential) due to pt being out of time frame of 48 hours since symptom onset and known GI  side effects in the elderly  2. Type II or unspecified type diabetes mellitus without mention of complication, uncontrolled -will get labs today for upcoming routine follow up  - Comprehensive metabolic panel - Hemoglobin A1c  3. Other and unspecified hyperlipidemia - Lipid panel  -given return precaution or when to seek emergency medical attention -- pt and son understand Getting fasting blood work today and to keep follow up appt with Nyoka Cowden next week

## 2013-11-07 NOTE — Patient Instructions (Signed)
mucinex DM every 12 hours ---- Dextromethorphan HBr 60 mg ...... Cough suppressant Guaifenesin 1200 mg .................................... Expectorant  Will give doxycycline twice daily for 10 days   Increase hydration -- if he becomes more lethargic unable to tolerate intake by mouth will need to seek immediate medical attention   Bronchitis Bronchitis is inflammation of the airways that extend from the windpipe into the lungs (bronchi). The inflammation often causes mucus to develop, which leads to a cough. If the inflammation becomes severe, it may cause shortness of breath. CAUSES  Bronchitis may be caused by:   Viral infections.   Bacteria.   Cigarette smoke.   Allergens, pollutants, and other irritants.  SIGNS AND SYMPTOMS  The most common symptom of bronchitis is a frequent cough that produces mucus. Other symptoms include:  Fever.   Body aches.   Chest congestion.   Chills.   Shortness of breath.   Sore throat.  DIAGNOSIS  Bronchitis is usually diagnosed through a medical history and physical exam. Tests, such as chest X-rays, are sometimes done to rule out other conditions.  TREATMENT  You may need to avoid contact with whatever caused the problem (smoking, for example). Medicines are sometimes needed. These may include:  Antibiotics. These may be prescribed if the condition is caused by bacteria.  Cough suppressants. These may be prescribed for relief of cough symptoms.   Inhaled medicines. These may be prescribed to help open your airways and make it easier for you to breathe.   Steroid medicines. These may be prescribed for those with recurrent (chronic) bronchitis. HOME CARE INSTRUCTIONS  Get plenty of rest.   Drink enough fluids to keep your urine clear or pale yellow (unless you have a medical condition that requires fluid restriction). Increasing fluids may help thin your secretions and will prevent dehydration.   Only take  over-the-counter or prescription medicines as directed by your health care provider.  Only take antibiotics as directed. Make sure you finish them even if you start to feel better.  Avoid secondhand smoke, irritating chemicals, and strong fumes. These will make bronchitis worse. If you are a smoker, quit smoking. Consider using nicotine gum or skin patches to help control withdrawal symptoms. Quitting smoking will help your lungs heal faster.   Put a cool-mist humidifier in your bedroom at night to moisten the air. This may help loosen mucus. Change the water in the humidifier daily. You can also run the hot water in your shower and sit in the bathroom with the door closed for 5 10 minutes.   Follow up with your health care provider as directed.   Wash your hands frequently to avoid catching bronchitis again or spreading an infection to others.  SEEK MEDICAL CARE IF: Your symptoms do not improve after 1 week of treatment.  SEEK IMMEDIATE MEDICAL CARE IF:  Your fever increases.  You have chills.   You have chest pain.   You have worsening shortness of breath.   You have bloody sputum.  You faint.  You have lightheadedness.  You have a severe headache.   You vomit repeatedly. MAKE SURE YOU:   Understand these instructions.  Will watch your condition.  Will get help right away if you are not doing well or get worse. Document Released: 09/27/2005 Document Revised: 07/18/2013 Document Reviewed: 05/22/2013 [facility] Patient Information 2014 [place].

## 2013-11-08 LAB — COMPREHENSIVE METABOLIC PANEL
ALT: 22 IU/L (ref 0–44)
AST: 28 IU/L (ref 0–40)
Albumin/Globulin Ratio: 1.7 (ref 1.1–2.5)
Albumin: 4.1 g/dL (ref 3.2–4.6)
Alkaline Phosphatase: 82 IU/L (ref 39–117)
BUN/Creatinine Ratio: 26 — ABNORMAL HIGH (ref 10–22)
BUN: 26 mg/dL (ref 10–36)
CO2: 29 mmol/L (ref 18–29)
Calcium: 8.8 mg/dL (ref 8.6–10.2)
Chloride: 95 mmol/L — ABNORMAL LOW (ref 97–108)
Creatinine, Ser: 1 mg/dL (ref 0.76–1.27)
GFR calc Af Amer: 74 mL/min/{1.73_m2} (ref >59)
GFR calc non Af Amer: 64 mL/min/{1.73_m2} (ref >59)
Globulin, Total: 2.4 g/dL (ref 1.5–4.5)
Glucose: 117 mg/dL — ABNORMAL HIGH (ref 65–99)
Potassium: 3.4 mmol/L — ABNORMAL LOW (ref 3.5–5.2)
Sodium: 139 mmol/L (ref 134–144)
Total Bilirubin: 0.4 mg/dL (ref 0.0–1.2)
Total Protein: 6.5 g/dL (ref 6.0–8.5)

## 2013-11-08 LAB — CBC WITH DIFFERENTIAL
Basophils Absolute: 0 10*3/uL (ref 0.0–0.2)
Basos: 0 %
Eos: 0 %
Eosinophils Absolute: 0 10*3/uL (ref 0.0–0.4)
HCT: 35 % — ABNORMAL LOW (ref 37.5–51.0)
Hemoglobin: 12 g/dL — ABNORMAL LOW (ref 12.6–17.7)
Immature Grans (Abs): 0 10*3/uL (ref 0.0–0.1)
Immature Granulocytes: 0 %
Lymphocytes Absolute: 0.5 10*3/uL — ABNORMAL LOW (ref 0.7–3.1)
Lymphs: 11 %
MCH: 30.5 pg (ref 26.6–33.0)
MCHC: 34.3 g/dL (ref 31.5–35.7)
MCV: 89 fL (ref 79–97)
Monocytes Absolute: 0.6 10*3/uL (ref 0.1–0.9)
Monocytes: 14 %
Neutrophils Absolute: 3.4 10*3/uL (ref 1.4–7.0)
Neutrophils Relative %: 75 %
Platelets: 137 10*3/uL — ABNORMAL LOW (ref 150–379)
RBC: 3.93 x10E6/uL — ABNORMAL LOW (ref 4.14–5.80)
RDW: 14.4 % (ref 12.3–15.4)
WBC: 4.5 10*3/uL (ref 3.4–10.8)

## 2013-11-08 LAB — LIPID PANEL
Chol/HDL Ratio: 2 ratio units (ref 0.0–5.0)
Cholesterol, Total: 152 mg/dL (ref 100–199)
HDL: 77 mg/dL (ref >39)
LDL Calculated: 63 mg/dL (ref 0–99)
Triglycerides: 61 mg/dL (ref 0–149)
VLDL Cholesterol Cal: 12 mg/dL (ref 5–40)

## 2013-11-08 LAB — HEMOGLOBIN A1C
Est. average glucose Bld gHb Est-mCnc: 151 mg/dL
Hgb A1c MFr Bld: 6.9 % — ABNORMAL HIGH (ref 4.8–5.6)

## 2013-11-09 ENCOUNTER — Other Ambulatory Visit: Payer: Medicare Other

## 2013-11-09 ENCOUNTER — Telehealth: Payer: Self-pay

## 2013-11-09 NOTE — Telephone Encounter (Signed)
Per Janett Billow: Call patient's daughter and inform her that she glanced at the labs and overall they are ok, further details/recommendations to be given at pending appointment on Tuesday with Dr.Green.  Left message on voicemail for patient/daughter to return call when available

## 2013-11-13 ENCOUNTER — Ambulatory Visit: Payer: Medicare Other | Admitting: Internal Medicine

## 2013-11-13 NOTE — Telephone Encounter (Signed)
Patient will be seen today by Dr.Green

## 2013-11-14 ENCOUNTER — Encounter: Payer: Self-pay | Admitting: Internal Medicine

## 2013-11-14 ENCOUNTER — Non-Acute Institutional Stay: Payer: Medicare Other | Admitting: Internal Medicine

## 2013-11-14 ENCOUNTER — Other Ambulatory Visit: Payer: Self-pay | Admitting: *Deleted

## 2013-11-14 DIAGNOSIS — R32 Unspecified urinary incontinence: Secondary | ICD-10-CM

## 2013-11-14 DIAGNOSIS — R638 Other symptoms and signs concerning food and fluid intake: Secondary | ICD-10-CM

## 2013-11-14 DIAGNOSIS — J069 Acute upper respiratory infection, unspecified: Secondary | ICD-10-CM

## 2013-11-14 DIAGNOSIS — R5381 Other malaise: Secondary | ICD-10-CM

## 2013-11-14 DIAGNOSIS — R269 Unspecified abnormalities of gait and mobility: Secondary | ICD-10-CM

## 2013-11-14 DIAGNOSIS — E1165 Type 2 diabetes mellitus with hyperglycemia: Secondary | ICD-10-CM

## 2013-11-14 DIAGNOSIS — IMO0001 Reserved for inherently not codable concepts without codable children: Secondary | ICD-10-CM

## 2013-11-14 DIAGNOSIS — R4182 Altered mental status, unspecified: Secondary | ICD-10-CM

## 2013-11-14 DIAGNOSIS — E639 Nutritional deficiency, unspecified: Secondary | ICD-10-CM | POA: Insufficient documentation

## 2013-11-14 NOTE — Progress Notes (Signed)
Patient ID: Martin West, male   DOB: [birth year], 80 y.o.   MRN: [id number]    Location:    Home visit  Place of Service: Home (12)    No Known Allergies  Chief Complaint  Patient presents with  . Acute Visit    decline in condition    HPI:  Received call from his daughter, Martin West. Since he was seen here 11/07/13, he had declined. She requested home visit because he is too weak to travel to the office.  His sister died in mid 11-27-2013. He has been stressed by this. He was unable to go to the funereal. Family members from out of town came to visit. They had respiratory infection and then he acquired the illness. Multiple family members were afflicted. Started with sore throat, then progressed to sinus and nasal congestion and the to dyspnea and chest congestion with cough and clear sputum.  Was seen in office 11/07/13. Given doxycycline and advised to use Mucinex. Subsequent CXR was normal.   Now he can't dress himself. He is fearful about getting in and out of bed at night due to poor balance. He has to go to the bathroom several times at night. His daughter is assisting, but is exhausted.  She has noted a different sound to his breathing. She believes he needs and expectorant and decongestant.  He complains of a lump in his throat that interferes with swallowing. His intake of food and fluids is very poor.  He fell and slammed his his head on the toilet about a week ago. The left eye is swollen and purple.  Daughter believes his memory is much worse than uusual. He has had hallucinations at night. He forgets which way he is going. There are anxiety attacks, especially at night when he needs to go to the bathroom.  Daughter is distressed that tests she asked for were not done at his OV: B12,urine specimen.  Medications: Patient's Medications  New Prescriptions   No medications on file  Previous Medications   CHOLECALCIFEROL (VITAMIN D) 2000 UNITS TABLET    Take 2,000  Units by mouth daily.   GLUCOSE BLOOD (FREESTYLE TEST STRIPS) TEST STRIP    Test Blood sugar twice daily. Dx: 250.02   HYDROCORTISONE CREAM 1 %    Apply topically daily. Apply daily to scaling areas on face.   LANCETS (FREESTYLE) LANCETS    Use once daily to help control blood sugar. 250.00  Modified Medications   No medications on file  Discontinued Medications   DOXYCYCLINE (VIBRA-TABS) 100 MG TABLET    Take 1 tablet (100 mg total) by mouth 2 (two) times daily.     Review of Systems  Constitutional: Positive for activity change, appetite change and fatigue. Negative for fever, chills, diaphoresis and unexpected weight change.  HENT: Positive for dental problem (Wears dentures) and hearing loss.   Eyes: Negative.   Respiratory: Positive for cough and shortness of breath.   Cardiovascular: Negative for chest pain, palpitations and leg swelling.  Gastrointestinal:       Mild dysphagia  Endocrine:       History of diabetes. Currently diet controlled.  Genitourinary: Positive for frequency.       Nocturia x 4. Slow, weak urinary stream. History of prostate enlargement.  Musculoskeletal: Positive for arthralgias, back pain and gait problem (Unstable. Tendency for retropulsion.).  Skin:       Left periorbital ecchymosis. Left temple hematoma. Ecchymosis is beginning to turn yellow and green.  Allergic/Immunologic: Negative.   Neurological: Positive for dizziness and tremors.       Parkinson features. Increased rigidity. Retropulsion. Negative glabellar response. Mild increased rigidity. Tremor at rest.  Hematological: Negative.   Psychiatric/Behavioral: Positive for decreased concentration. The patient is nervous/anxious.     Filed Vitals:   11/14/13 1047  Pulse: 90  Temp: 98.6 F (37 C)  Resp: 22   Physical Exam  Constitutional: He is oriented to person, place, and time. No distress.  Frail, elderly male.  HENT:  Head: Normocephalic and atraumatic.  Right Ear: External ear  normal.  Left Ear: External ear normal.  Nose: Nose normal.  Mouth/Throat: Oropharynx is clear and moist.  Upper and lower dentures. Nasal congestion.  Eyes: Conjunctivae and EOM are normal. Pupils are equal, round, and reactive to light.  Neck: No JVD present. No tracheal deviation present. No thyromegaly present.  Cardiovascular: Normal rate, regular rhythm and normal heart sounds.  Exam reveals no friction rub.   No murmur heard. Pulmonary/Chest: No respiratory distress. He has no wheezes. He has rales (left chest posteriorly and in mid to lower lung.).  Abdominal: He exhibits no distension and no mass. There is no tenderness.  Musculoskeletal: He exhibits no edema and no tenderness.  Unstable gait. Generalized muscular weakness. Able to rise from chair and walk across the room. Needs some assistance for stability. Using walker, but not reliable about using it safely.  Lymphadenopathy:    He has no cervical adenopathy.  Neurological: He is alert and oriented to person, place, and time. No cranial nerve deficit. Coordination abnormal.  11/14/1998 9 MMSE 26/30. Failed clock drawing. Loss of vibratory sensation bilaterally. No focal neurologic abnormalities.  Skin: No rash noted. No erythema. No pallor.  Resolving left periorbital bruising and left temple hematoma.  Psychiatric: He has a normal mood and affect. His behavior is normal. Thought content normal.     Labs reviewed: Office Visit on 11/07/2013  Component Date Value Range Status  . WBC 11/07/2013 4.5  3.4 - 10.8 x10E3/uL Final  . RBC 11/07/2013 3.93* 4.14 - 5.80 x10E6/uL Final  . Hemoglobin 11/07/2013 12.0* 12.6 - 17.7 g/dL Final  . HCT 11/07/2013 35.0* 37.5 - 51.0 % Final  . MCV 11/07/2013 89  79 - 97 fL Final  . MCH 11/07/2013 30.5  26.6 - 33.0 pg Final  . MCHC 11/07/2013 34.3  31.5 - 35.7 g/dL Final  . RDW 11/07/2013 14.4  12.3 - 15.4 % Final  . Platelets 11/07/2013 137* 150 - 379 x10E3/uL Final  . Neutrophils Relative  % 11/07/2013 75   Final  . Lymphs 11/07/2013 11   Final  . Monocytes 11/07/2013 14   Final  . Eos 11/07/2013 0   Final  . Basos 11/07/2013 0   Final  . Neutrophils Absolute 11/07/2013 3.4  1.4 - 7.0 x10E3/uL Final  . Lymphocytes Absolute 11/07/2013 0.5* 0.7 - 3.1 x10E3/uL Final  . Monocytes Absolute 11/07/2013 0.6  0.1 - 0.9 x10E3/uL Final  . Eosinophils Absolute 11/07/2013 0.0  0.0 - 0.4 x10E3/uL Final  . Basophils Absolute 11/07/2013 0.0  0.0 - 0.2 x10E3/uL Final  . Immature Granulocytes 11/07/2013 0   Final  . Immature Grans (Abs) 11/07/2013 0.0  0.0 - 0.1 x10E3/uL Final  . Glucose 11/07/2013 117* 65 - 99 mg/dL Final  . BUN 11/07/2013 26  10 - 36 mg/dL Final  . Creatinine, Ser 11/07/2013 1.00  0.76 - 1.27 mg/dL Final  . GFR calc non Af Amer 11/07/2013 64  >  59 mL/min/1.73 Final  . GFR calc Af Amer 11/07/2013 74  >59 mL/min/1.73 Final  . BUN/Creatinine Ratio 11/07/2013 26* 10 - 22 Final  . Sodium 11/07/2013 139  134 - 144 mmol/L Final  . Potassium 11/07/2013 3.4* 3.5 - 5.2 mmol/L Final  . Chloride 11/07/2013 95* 97 - 108 mmol/L Final  . CO2 11/07/2013 29  18 - 29 mmol/L Final  . Calcium 11/07/2013 8.8  8.6 - 10.2 mg/dL Final                 **Please note reference interval change**  . Total Protein 11/07/2013 6.5  6.0 - 8.5 g/dL Final  . Albumin 11/07/2013 4.1  3.2 - 4.6 g/dL Final  . Globulin, Total 11/07/2013 2.4  1.5 - 4.5 g/dL Final  . Albumin/Globulin Ratio 11/07/2013 1.7  1.1 - 2.5 Final  . Total Bilirubin 11/07/2013 0.4  0.0 - 1.2 mg/dL Final  . Alkaline Phosphatase 11/07/2013 82  39 - 117 IU/L Final  . AST 11/07/2013 28  0 - 40 IU/L Final  . ALT 11/07/2013 22  0 - 44 IU/L Final  . Hemoglobin A1C 11/07/2013 6.9* 4.8 - 5.6 % Final   Comment:          Increased risk for diabetes: 5.7 - 6.4                                   Diabetes: >6.4                                   Glycemic control for adults with diabetes: <7.0  . Estimated average glucose 11/07/2013 151   Final  .  Cholesterol, Total 11/07/2013 152  100 - 199 mg/dL Final  . Triglycerides 11/07/2013 61  0 - 149 mg/dL Final  . HDL 11/07/2013 77  >39 mg/dL Final   Comment: According to ATP-III Guidelines, HDL-C >59 mg/dL is considered a                          negative risk factor for CHD.  Marland Kitchen VLDL Cholesterol Cal 11/07/2013 12  5 - 40 mg/dL Final  . LDL Calculated 11/07/2013 63  0 - 99 mg/dL Final  . Chol/HDL Ratio 11/07/2013 2.0  0.0 - 5.0 ratio units Final   Comment:                                   T. Chol/HDL Ratio                                                                      Men  Women                                                        1/2 Avg.Risk  3.4    3.3  Avg.Risk  5.0    4.4                                                         2X Avg.Risk  9.6    7.1                                                         3X Avg.Risk 23.4   11.0    11/07/13 CXR: no active disease  Assessment/Plan  Debility: related to acute respiratory illness which was likely viral origin. This has been complicated by poor oral intake. Safe ambulation and self care skills are now compromised.  Acute respiratory disease: continues with nasal and sinus congestion as well as cough and complaints being unable to breathe well  Abnormality of gait: chronic issue with recent deterioration   Unspecified urinary incontinence: chronic issue. Wears Depends at night, but is still fearful he will wet the bed. Has a prior history of UTI. Denies  dysuria.  Type II or unspecified type diabetes mellitus without mention of complication, uncontrolled: modest elevation on recent lab.  Inadequate oral nutritional intake: poor intake of nourishment and fluids. Daughter is trying to encourage him , but he is obstinate and refuse to increase intake.  Altered mental status: Most likely related to acute decline with respiratory illness. Result is that his confusion is  worse at times and he "hallucinates" per his daughter. At the time I examined him,  He was appropriate in responses. I suspect he may have a low grade delirium associated with his recent acute illness that is associated with some mild "sundowning" symptoms. The presence of the left temporal bruising is worrisome. I discussed the possibility of subdural hematoma. This situation may ultimately need CT brain if symptoms do not resolve. He will need to be watched closely.  Recommend:   Home Health Service with nurse evaluation regarding mental stratus and debility and adequacy of oral intake  CBC, CMP, B12, UA and culture  Nasal saline prn  Humidifier  Benadryl prn  Home PT (daughter may arrange to have his usual exercise therapist come to the home)  He needs assistance with bathing and movement in the home. He needs encouragement and some assistance with meals and oral intake.

## 2013-11-15 ENCOUNTER — Telehealth: Payer: Self-pay | Admitting: *Deleted

## 2013-11-15 NOTE — Telephone Encounter (Signed)
1. Rytary a drug Sherri wants you to research for Parkinsonism Dx.    2. Certificate of Medical Necessities for Lift Chair.    Told her that she needed to take the Rx Dr. Nyoka Cowden gave her to [facility] and they would generate the paperwork for the [facility] and we would be the ones to fill out any paperwork at that time. She argued with me and stated  that we need to get the lift chair first. I explained again that she needed to take the Rx that was given to her by Dr. Nyoka Cowden to [facility] and we would go from there. She doesn't believe me.She stated she would check into that.  3. [facility] Paperwork she gave you to fill out she stated that you needed to fill it out within the next week and she wants a copy of it before you mail it.   Told her I would send the message to you.

## 2013-11-16 ENCOUNTER — Other Ambulatory Visit: Payer: Self-pay | Admitting: *Deleted

## 2013-11-16 DIAGNOSIS — R5383 Other fatigue: Secondary | ICD-10-CM

## 2013-11-16 DIAGNOSIS — D518 Other vitamin B12 deficiency anemias: Secondary | ICD-10-CM

## 2013-11-16 DIAGNOSIS — R4182 Altered mental status, unspecified: Secondary | ICD-10-CM

## 2013-11-16 DIAGNOSIS — R5381 Other malaise: Secondary | ICD-10-CM

## 2013-11-16 DIAGNOSIS — G309 Alzheimer's disease, unspecified: Secondary | ICD-10-CM

## 2013-11-16 DIAGNOSIS — R32 Unspecified urinary incontinence: Secondary | ICD-10-CM

## 2013-11-16 DIAGNOSIS — Z79899 Other long term (current) drug therapy: Secondary | ICD-10-CM

## 2013-11-16 DIAGNOSIS — F0280 Dementia in other diseases classified elsewhere without behavioral disturbance: Secondary | ICD-10-CM

## 2013-11-16 DIAGNOSIS — E46 Unspecified protein-calorie malnutrition: Secondary | ICD-10-CM

## 2013-11-16 DIAGNOSIS — E1165 Type 2 diabetes mellitus with hyperglycemia: Secondary | ICD-10-CM

## 2013-11-16 DIAGNOSIS — IMO0001 Reserved for inherently not codable concepts without codable children: Secondary | ICD-10-CM

## 2013-11-19 ENCOUNTER — Other Ambulatory Visit: Payer: Medicare Other

## 2013-11-19 ENCOUNTER — Other Ambulatory Visit: Payer: Self-pay | Admitting: *Deleted

## 2013-11-19 ENCOUNTER — Telehealth: Payer: Self-pay | Admitting: *Deleted

## 2013-11-19 DIAGNOSIS — R5381 Other malaise: Secondary | ICD-10-CM

## 2013-11-19 DIAGNOSIS — R4182 Altered mental status, unspecified: Secondary | ICD-10-CM

## 2013-11-19 DIAGNOSIS — D518 Other vitamin B12 deficiency anemias: Secondary | ICD-10-CM

## 2013-11-19 DIAGNOSIS — R5383 Other fatigue: Secondary | ICD-10-CM

## 2013-11-19 DIAGNOSIS — R32 Unspecified urinary incontinence: Secondary | ICD-10-CM

## 2013-11-19 NOTE — Telephone Encounter (Signed)
Patient caregiver brought in Patient this morning for bloodwork. Home Health was suppose to draw this, but caregiver stated that the daughter denied the [facility] when they came out.

## 2013-11-20 LAB — CBC WITH DIFFERENTIAL/PLATELET
BASOS ABS: 0 10*3/uL (ref 0.0–0.2)
BASOS: 0 %
EOS ABS: 0 10*3/uL (ref 0.0–0.4)
Eos: 1 %
HCT: 36.9 % — ABNORMAL LOW (ref 37.5–51.0)
Hemoglobin: 12.2 g/dL — ABNORMAL LOW (ref 12.6–17.7)
Immature Grans (Abs): 0 10*3/uL (ref 0.0–0.1)
Immature Granulocytes: 0 %
Lymphocytes Absolute: 1 10*3/uL (ref 0.7–3.1)
Lymphs: 14 %
MCH: 30.3 pg (ref 26.6–33.0)
MCHC: 33.1 g/dL (ref 31.5–35.7)
MCV: 92 fL (ref 79–97)
Monocytes Absolute: 0.4 10*3/uL (ref 0.1–0.9)
Monocytes: 5 %
NEUTROS PCT: 80 %
Neutrophils Absolute: 5.8 10*3/uL (ref 1.4–7.0)
RBC: 4.03 x10E6/uL — AB (ref 4.14–5.80)
RDW: 14.6 % (ref 12.3–15.4)
WBC: 7.2 10*3/uL (ref 3.4–10.8)

## 2013-11-20 LAB — COMPREHENSIVE METABOLIC PANEL
ALT: 17 IU/L (ref 0–44)
AST: 17 IU/L (ref 0–40)
Albumin/Globulin Ratio: 1.9 (ref 1.1–2.5)
Albumin: 4.3 g/dL (ref 3.2–4.6)
Alkaline Phosphatase: 88 IU/L (ref 39–117)
BUN / CREAT RATIO: 23 — AB (ref 10–22)
BUN: 21 mg/dL (ref 10–36)
CHLORIDE: 93 mmol/L — AB (ref 97–108)
CO2: 31 mmol/L — ABNORMAL HIGH (ref 18–29)
CREATININE: 0.91 mg/dL (ref 0.76–1.27)
Calcium: 9.3 mg/dL (ref 8.6–10.2)
GFR calc Af Amer: 83 mL/min/{1.73_m2} (ref >59)
GFR calc non Af Amer: 72 mL/min/{1.73_m2} (ref >59)
GLOBULIN, TOTAL: 2.3 g/dL (ref 1.5–4.5)
Glucose: 146 mg/dL — ABNORMAL HIGH (ref 65–99)
Potassium: 2.9 mmol/L — ABNORMAL LOW (ref 3.5–5.2)
Sodium: 138 mmol/L (ref 134–144)
Total Bilirubin: 0.8 mg/dL (ref 0.0–1.2)
Total Protein: 6.6 g/dL (ref 6.0–8.5)

## 2013-11-20 LAB — URINALYSIS
BILIRUBIN UA: NEGATIVE
Glucose, UA: NEGATIVE
KETONES UA: NEGATIVE
Leukocytes, UA: NEGATIVE
Nitrite, UA: NEGATIVE
SPEC GRAV UA: 1.027 (ref 1.005–1.030)
UUROB: 0.2 mg/dL (ref 0.0–1.9)
pH, UA: 6 (ref 5.0–7.5)

## 2013-11-20 LAB — VITAMIN B12: Vitamin B-12: 546 pg/mL (ref 211–946)

## 2013-11-20 LAB — URINE CULTURE

## 2013-11-22 ENCOUNTER — Telehealth: Payer: Self-pay | Admitting: Internal Medicine

## 2013-11-22 ENCOUNTER — Encounter: Payer: Self-pay | Admitting: Internal Medicine

## 2013-11-22 NOTE — Telephone Encounter (Signed)
Placed call  To his daughter, Serina Cowper. Patient is eating well, but still has poor intake of fluids. She worries about his hydration status. He is able to go outside the home to the physiotherapist he was seeing at [facility] gym. He is much weaker than usual, but is able to participate in exercises to a limited degree. In the home, he has become totally dependent of the presence and assistance of others to safely move. He needs encouragement at meals and prompting to swallow.  He remains agitated and nervous at night. Someone must remain with him in his room.  Lab done 11/19/13 was normal except for K+2.9. Hgb was 12.2. B12 normal. No infection in urine   Lab on 11/19/2013  Component Date Value Ref Range Status  . Urine Culture, Routine 11/19/2013 Final report   Final  . Result 1 11/19/2013 Comment   Final   Comment: Mixed urogenital flora                          50,000-100,000 colony forming units per mL  . Specific Gravity, UA 11/19/2013 1.027  1.005 - 1.030 Final  . pH, UA 11/19/2013 6.0  5.0 - 7.5 Final  . Color, UA 11/19/2013 Yellow  Yellow Final  . Appearance Ur 11/19/2013 Clear  Clear Final  . Leukocytes, UA 11/19/2013 Negative  Negative Final  . Protein, UA 11/19/2013 1+* Negative/Trace Final  . Glucose, UA 11/19/2013 Negative  Negative Final  . Ketones, UA 11/19/2013 Negative  Negative Final  . RBC, UA 11/19/2013 Trace* Negative Final  . Bilirubin, UA 11/19/2013 Negative  Negative Final  . Urobilinogen, Ur 11/19/2013 0.2  0.0 - 1.9 mg/dL Final  . Nitrite, UA 11/19/2013 Negative  Negative Final  . Vitamin B-12 11/19/2013 546  211 - 946 pg/mL Final  . Glucose 11/19/2013 146* 65 - 99 mg/dL Final  . BUN 11/19/2013 21  10 - 36 mg/dL Final  . Creatinine, Ser 11/19/2013 0.91  0.76 - 1.27 mg/dL Final  . GFR calc non Af Amer 11/19/2013 72  >59 mL/min/1.73 Final  . GFR calc Af Amer 11/19/2013 83  >59 mL/min/1.73 Final  . BUN/Creatinine Ratio 11/19/2013 23* 10 - 22 Final  . Sodium 11/19/2013  138  134 - 144 mmol/L Final  . Potassium 11/19/2013 2.9* 3.5 - 5.2 mmol/L Final  . Chloride 11/19/2013 93* 97 - 108 mmol/L Final  . CO2 11/19/2013 31* 18 - 29 mmol/L Final  . Calcium 11/19/2013 9.3  8.6 - 10.2 mg/dL Final  . Total Protein 11/19/2013 6.6  6.0 - 8.5 g/dL Final  . Albumin 11/19/2013 4.3  3.2 - 4.6 g/dL Final  . Globulin, Total 11/19/2013 2.3  1.5 - 4.5 g/dL Final  . Albumin/Globulin Ratio 11/19/2013 1.9  1.1 - 2.5 Final  . Total Bilirubin 11/19/2013 0.8  0.0 - 1.2 mg/dL Final  . Alkaline Phosphatase 11/19/2013 88  39 - 117 IU/L Final  . AST 11/19/2013 17  0 - 40 IU/L Final  . ALT 11/19/2013 17  0 - 44 IU/L Final  . WBC 11/19/2013 7.2  3.4 - 10.8 x10E3/uL Final  . RBC 11/19/2013 4.03* 4.14 - 5.80 x10E6/uL Final  . Hemoglobin 11/19/2013 12.2* 12.6 - 17.7 g/dL Final  . HCT 11/19/2013 36.9* 37.5 - 51.0 % Final  . MCV 11/19/2013 92  79 - 97 fL Final  . MCH 11/19/2013 30.3  26.6 - 33.0 pg Final  . MCHC 11/19/2013 33.1  31.5 - 35.7  g/dL Final  . RDW 11/19/2013 14.6  12.3 - 15.4 % Final  . Neutrophils Relative % 11/19/2013 80   Final  . Lymphs 11/19/2013 14   Final  . Monocytes 11/19/2013 5   Final  . Eos 11/19/2013 1   Final  . Basos 11/19/2013 0   Final  . Neutrophils Absolute 11/19/2013 5.8  1.4 - 7.0 x10E3/uL Final  . Lymphocytes Absolute 11/19/2013 1.0  0.7 - 3.1 x10E3/uL Final  . Monocytes Absolute 11/19/2013 0.4  0.1 - 0.9 x10E3/uL Final  . Eosinophils Absolute 11/19/2013 0.0  0.0 - 0.4 x10E3/uL Final  . Basophils Absolute 11/19/2013 0.0  0.0 - 0.2 x10E3/uL Final  . Immature Granulocytes 11/19/2013 0   Final  . Immature Grans (Abs) 11/19/2013 0.0  0.0 - 0.1 x10E3/uL Final  . specimen status report 11/19/2013 Comment   Final   Comment: Verbal Order                          See below:                          Comment:                          The [place] Code of [employer] requires a written and                          signed request be forwarded to a  laboratory following a verbal order                          of a laboratory test.  Please assist [place] to meet this requirement and                          to complete our records.                          Date:______________________________                          ICD-9/Diagnosis Code(s):______________________________________________                          Physician or Authorized Designee:_____________________________________                                                                        Please Print                          Physician or Authorized Designee Signature:                          ______________________________________________________________________                          Your Passenger transport manager Of The Test(s) Listed  Please provide requested information and fax to [phone].                          Additional Test(s) Requested                          Comment:                          Test(s) added per Jearld Adjutant at account 11-20-2013                          Logged by [place]                          Test# [id number] Vitamin B12  . Vitamin B-12 11/19/2013 490  211 - 946 pg/mL Final  . specimen status report 11/19/2013 Comment   Preliminary   Comment: Written Authorization                          Written Authorization  Office Visit on 11/07/2013  Component Date Value Ref Range Status  . WBC 11/07/2013 4.5  3.4 - 10.8 x10E3/uL Final  . RBC 11/07/2013 3.93* 4.14 - 5.80 x10E6/uL Final  . Hemoglobin 11/07/2013 12.0* 12.6 - 17.7 g/dL Final  . HCT 11/07/2013 35.0* 37.5 - 51.0 % Final  . MCV 11/07/2013 89  79 - 97 fL Final  . MCH 11/07/2013 30.5  26.6 - 33.0 pg Final  . MCHC 11/07/2013 34.3  31.5 - 35.7 g/dL Final  . RDW 11/07/2013 14.4  12.3 - 15.4 % Final  . Platelets 11/07/2013 137* 150 - 379 x10E3/uL Final  . Neutrophils Relative % 11/07/2013 75   Final  . Lymphs 11/07/2013 11   Final  . Monocytes 11/07/2013 14   Final   . Eos 11/07/2013 0   Final  . Basos 11/07/2013 0   Final  . Neutrophils Absolute 11/07/2013 3.4  1.4 - 7.0 x10E3/uL Final  . Lymphocytes Absolute 11/07/2013 0.5* 0.7 - 3.1 x10E3/uL Final  . Monocytes Absolute 11/07/2013 0.6  0.1 - 0.9 x10E3/uL Final  . Eosinophils Absolute 11/07/2013 0.0  0.0 - 0.4 x10E3/uL Final  . Basophils Absolute 11/07/2013 0.0  0.0 - 0.2 x10E3/uL Final  . Immature Granulocytes 11/07/2013 0   Final  . Immature Grans (Abs) 11/07/2013 0.0  0.0 - 0.1 x10E3/uL Final  . Glucose 11/07/2013 117* 65 - 99 mg/dL Final  . BUN 11/07/2013 26  10 - 36 mg/dL Final  . Creatinine, Ser 11/07/2013 1.00  0.76 - 1.27 mg/dL Final  . GFR calc non Af Amer 11/07/2013 64  >59 mL/min/1.73 Final  . GFR calc Af Amer 11/07/2013 74  >59 mL/min/1.73 Final  . BUN/Creatinine Ratio 11/07/2013 26* 10 - 22 Final  . Sodium 11/07/2013 139  134 - 144 mmol/L Final  . Potassium 11/07/2013 3.4* 3.5 - 5.2 mmol/L Final  . Chloride 11/07/2013 95* 97 - 108 mmol/L Final  . CO2 11/07/2013 29  18 - 29 mmol/L Final  . Calcium 11/07/2013 8.8  8.6 - 10.2 mg/dL Final                 **Please note reference interval change**  . Total Protein 11/07/2013 6.5  6.0 - 8.5 g/dL Final  . Albumin 11/07/2013 4.1  3.2 -  4.6 g/dL Final  . Globulin, Total 11/07/2013 2.4  1.5 - 4.5 g/dL Final  . Albumin/Globulin Ratio 11/07/2013 1.7  1.1 - 2.5 Final  . Total Bilirubin 11/07/2013 0.4  0.0 - 1.2 mg/dL Final  . Alkaline Phosphatase 11/07/2013 82  39 - 117 IU/L Final  . AST 11/07/2013 28  0 - 40 IU/L Final  . ALT 11/07/2013 22  0 - 44 IU/L Final  . Hemoglobin A1C 11/07/2013 6.9* 4.8 - 5.6 % Final   Comment:          Increased risk for diabetes: 5.7 - 6.4                                   Diabetes: >6.4                                   Glycemic control for adults with diabetes: <7.0  . Estimated average glucose 11/07/2013 151   Final  . Cholesterol, Total 11/07/2013 152  100 - 199 mg/dL Final  . Triglycerides 11/07/2013 61  0 -  149 mg/dL Final  . HDL 11/07/2013 77  >39 mg/dL Final   Comment: According to ATP-III Guidelines, HDL-C >59 mg/dL is considered a                          negative risk factor for CHD.  Marland Kitchen VLDL Cholesterol Cal 11/07/2013 12  5 - 40 mg/dL Final  . LDL Calculated 11/07/2013 63  0 - 99 mg/dL Final  . Chol/HDL Ratio 11/07/2013 2.0  0.0 - 5.0 ratio units Final   Comment:                                   T. Chol/HDL Ratio                                                                      Men  Women                                                        1/2 Avg.Risk  3.4    3.3                                                            Avg.Risk  5.0    4.4  2X Avg.Risk  9.6    7.1                                                         3X Avg.Risk 23.4   11.0   I discussed all lab at length with Mrs. Emerson. She had many questions. I have looked into the use of a new agent, Rytary, for Parkinson's. This is a combination of carbidopa and levodopa. He did not do well with Sinement in the past. I am not convinced this drug offers any safe benefit for him. The dysphagia is creating concerns regarding his hydration status. I will arrange for him to return for repeat BMP. Follow up of the CBC and obtainment of serum folate will be done.

## 2013-11-23 ENCOUNTER — Other Ambulatory Visit: Payer: Self-pay | Admitting: *Deleted

## 2013-11-23 DIAGNOSIS — E785 Hyperlipidemia, unspecified: Secondary | ICD-10-CM

## 2013-11-23 DIAGNOSIS — E559 Vitamin D deficiency, unspecified: Secondary | ICD-10-CM

## 2013-11-23 DIAGNOSIS — IMO0001 Reserved for inherently not codable concepts without codable children: Secondary | ICD-10-CM

## 2013-11-23 DIAGNOSIS — E1165 Type 2 diabetes mellitus with hyperglycemia: Principal | ICD-10-CM

## 2013-11-23 DIAGNOSIS — D518 Other vitamin B12 deficiency anemias: Secondary | ICD-10-CM

## 2013-11-26 ENCOUNTER — Telehealth: Payer: Self-pay | Admitting: *Deleted

## 2013-11-26 ENCOUNTER — Other Ambulatory Visit: Payer: Self-pay | Admitting: *Deleted

## 2013-11-26 DIAGNOSIS — IMO0002 Reserved for concepts with insufficient information to code with codable children: Secondary | ICD-10-CM

## 2013-11-26 DIAGNOSIS — G20 Parkinson's disease: Secondary | ICD-10-CM

## 2013-11-26 NOTE — Telephone Encounter (Signed)
Please refer to Hospice.

## 2013-11-26 NOTE — Telephone Encounter (Signed)
Patient daughter, Martin West, called and wants a referral for her dad to Hospice. Please Advise.

## 2013-11-26 NOTE — Telephone Encounter (Signed)
Referral placed in [facility]

## 2013-11-27 ENCOUNTER — Other Ambulatory Visit: Payer: Self-pay

## 2013-12-04 LAB — SPECIMEN STATUS REPORT

## 2013-12-05 LAB — SPECIMEN STATUS REPORT

## 2013-12-05 LAB — VITAMIN B12: Vitamin B-12: 490 pg/mL (ref 211–946)

## 2013-12-13 ENCOUNTER — Other Ambulatory Visit: Payer: Self-pay | Admitting: *Deleted

## 2013-12-13 MED ORDER — ZOLPIDEM TARTRATE 5 MG PO TABS: 5.0000 mg | ORAL_TABLET | Freq: Every evening | ORAL | Status: DC | PRN

## 2013-12-13 NOTE — Telephone Encounter (Signed)
Received a fax from Brandon Melnick, [place] with Hospice requesting sleep aid, to be used at HS only, hopefully with no sedating effects during the daytime hours.  Per Dr. Nyoka Cowden:  Zolpidem 5mg  One at bedtime as needed for sleep Called Brandon Melnick with Hospice and she will call in the medication.

## 2013-12-20 ENCOUNTER — Telehealth: Payer: Self-pay | Admitting: Internal Medicine

## 2013-12-20 NOTE — Telephone Encounter (Signed)
Received call from the Hospice nurse, Anell Barr on 12/19/13. Daughter of pt states the Ambien was not successful in helping him sleep. He now has a caretaker at night. This seems to to be helping him sleep better. Daughter reports that he is complaining more of pain in the elbows, shoulders, and back. She wants advice about what could help the pain , but not run the risk of sedation.She also wants this to in a liquid form due to his difficulty swallowing. I recommended liquid Advil.

## 2014-01-09 ENCOUNTER — Other Ambulatory Visit: Payer: Self-pay | Admitting: Internal Medicine

## 2014-01-09 MED ORDER — TRAZODONE HCL 100 MG PO TABS: ORAL_TABLET | ORAL | Status: AC

## 2014-02-07 ENCOUNTER — Other Ambulatory Visit: Payer: Self-pay

## 2014-02-07 ENCOUNTER — Other Ambulatory Visit: Payer: Medicare Other

## 2014-02-07 ENCOUNTER — Telehealth: Payer: Self-pay | Admitting: *Deleted

## 2014-02-07 NOTE — Telephone Encounter (Signed)
regarding urine/culture that is being asked for by Hospice Left message to rtn call [phone].  Is it okay to order a UA & Urine Culture. Please advise. thanks

## 2014-02-07 NOTE — Telephone Encounter (Signed)
Martin West with Hospice would like for [place] to call her with the results of the UA & culture @ [phone]. thanks

## 2014-02-07 NOTE — Telephone Encounter (Signed)
Do UA and culture. 

## 2014-02-08 LAB — URINALYSIS, ROUTINE W REFLEX MICROSCOPIC
BILIRUBIN UA: NEGATIVE
Glucose, UA: NEGATIVE
Ketones, UA: NEGATIVE
Leukocytes, UA: NEGATIVE
Nitrite, UA: NEGATIVE
RBC UA: NEGATIVE
Specific Gravity, UA: 1.024 (ref 1.005–1.030)
UUROB: 1 mg/dL (ref 0.0–1.9)
pH, UA: 7 (ref 5.0–7.5)

## 2014-02-08 LAB — URINE CULTURE

## 2014-02-08 NOTE — Telephone Encounter (Signed)
UA results given to [facility], still waiting on urine culture

## 2014-03-22 ENCOUNTER — Other Ambulatory Visit: Payer: Self-pay | Admitting: Internal Medicine

## 2014-03-22 ENCOUNTER — Encounter: Payer: Self-pay | Admitting: Internal Medicine

## 2014-03-22 DIAGNOSIS — R066 Hiccough: Secondary | ICD-10-CM | POA: Insufficient documentation

## 2014-03-22 MED ORDER — CHLORPROMAZINE HCL 10 MG PO TABS: ORAL_TABLET | ORAL | Status: AC

## 2014-03-26 ENCOUNTER — Telehealth: Payer: Self-pay | Admitting: Internal Medicine

## 2014-03-26 NOTE — Telephone Encounter (Signed)
Lengthy telephone call from the hospice nurse, Brandon Melnick, on 03/22/14. Mr. Wahba likely aspirated. He looked very poor. He is on oxygen. Patient and family were distressed. Initially there was a request for a medication to help his hiccups and also a request for Roxanol in case his distress got worse over the weekend. I was not in a position to fax a prescription. Hospice nurse contacted Dr. Konrad Dolores, but he declined to prescribe the Roxanol and recommended Thorazine for hiccups and for sedation because the patient was not sleeping well. Later, the hospice nurse was contacted again by the patient's daughter who did not feel that Thorazine was an adequate response. This order was canceled and Dr. Konrad Dolores did prescribe the Roxanol.  I was contacted again today 03/26/14 by the hospice nurse. Patient's daughter did not pick up the prescription for Roxanol. Pickups have resolved. He is breathing easier and appears to have gotten through this latest aspiration. There is a Financial controller who will be seeing the patient today. Recommendations have been made to put him on honey thickened liquids to try to prevent aspirations. Order was approved for the thickener by me.  Approximately 25 minutes have been consumed on the telephone corordinating care for this patient.

## 2014-07-04 ENCOUNTER — Telehealth: Payer: Self-pay | Admitting: Internal Medicine

## 2014-07-04 MED ORDER — SUVOREXANT 10 MG PO TABS: 1.0000 | ORAL_TABLET | Freq: Every day | ORAL | Status: AC

## 2014-07-04 NOTE — Telephone Encounter (Signed)
07/04/14 Received call from Hospice nurse, Brandon Melnick. Patient has been recertified for Hospice. He is declining and has weight loss and seems more withdrawn.  Main complaint is insomnia. Daughter felt he did not tolerate Ambien or Ativan due to hangover effect. Tramadol at 100 mg does not seem adequate.  I suggested a trial of Belsomra 10 mg hs. She will check with Dr. Lyman Speller at [facility]. Sometimes we have office samples that we could start him on.

## 2014-07-04 NOTE — Telephone Encounter (Signed)
Maura with hospice called and left message on my voicemail for some samples for patient. She stated that she had spoken with Dr. Nyoka Cowden concerning this. Samples left up front for family to pick up and left message on Maura's voicemail that they were available for pickup.

## 2014-07-04 NOTE — Addendum Note (Signed)
Addended by: Rafael Bihari A on: 07/04/2014 04:49 PM   Modules accepted: Orders, Medications

## 2014-07-31 ENCOUNTER — Telehealth: Payer: Self-pay | Admitting: *Deleted

## 2014-07-31 NOTE — Telephone Encounter (Signed)
Hospice Nurse, Regino Schultze called and wanted to know if it was ok for patient to get Flu Shot. Patient is unable to come to the office to get it, but nurse wants to know if she can come by the office next week and pick up one to give to patient. Please Advise.

## 2014-08-01 NOTE — Telephone Encounter (Signed)
Patient may have flu shot. It is our I. with me a week dispense this to the patient and have Mrs. Stephanie Coup give him a shot.

## 2014-08-02 NOTE — Telephone Encounter (Signed)
Spoke with Jobie Quaker, informed her of Dr.Green's response. Left note for Rodena Piety (Triage assistant) informing her that Jobie Quaker will come by Wednesday morning to pick-up a flu vaccine.

## 2014-08-07 ENCOUNTER — Ambulatory Visit: Payer: Medicare Other

## 2014-08-07 DIAGNOSIS — Z23 Encounter for immunization: Secondary | ICD-10-CM

## 2014-08-31 IMAGING — CR DG CHEST 2V
2 series · 2 of 2 positions shown · non-contrast
Comparison: None.

CLINICAL DATA: Upper respiratory infection, cough, congestion

EXAM:
CHEST  2 VIEW

[w chest lat]
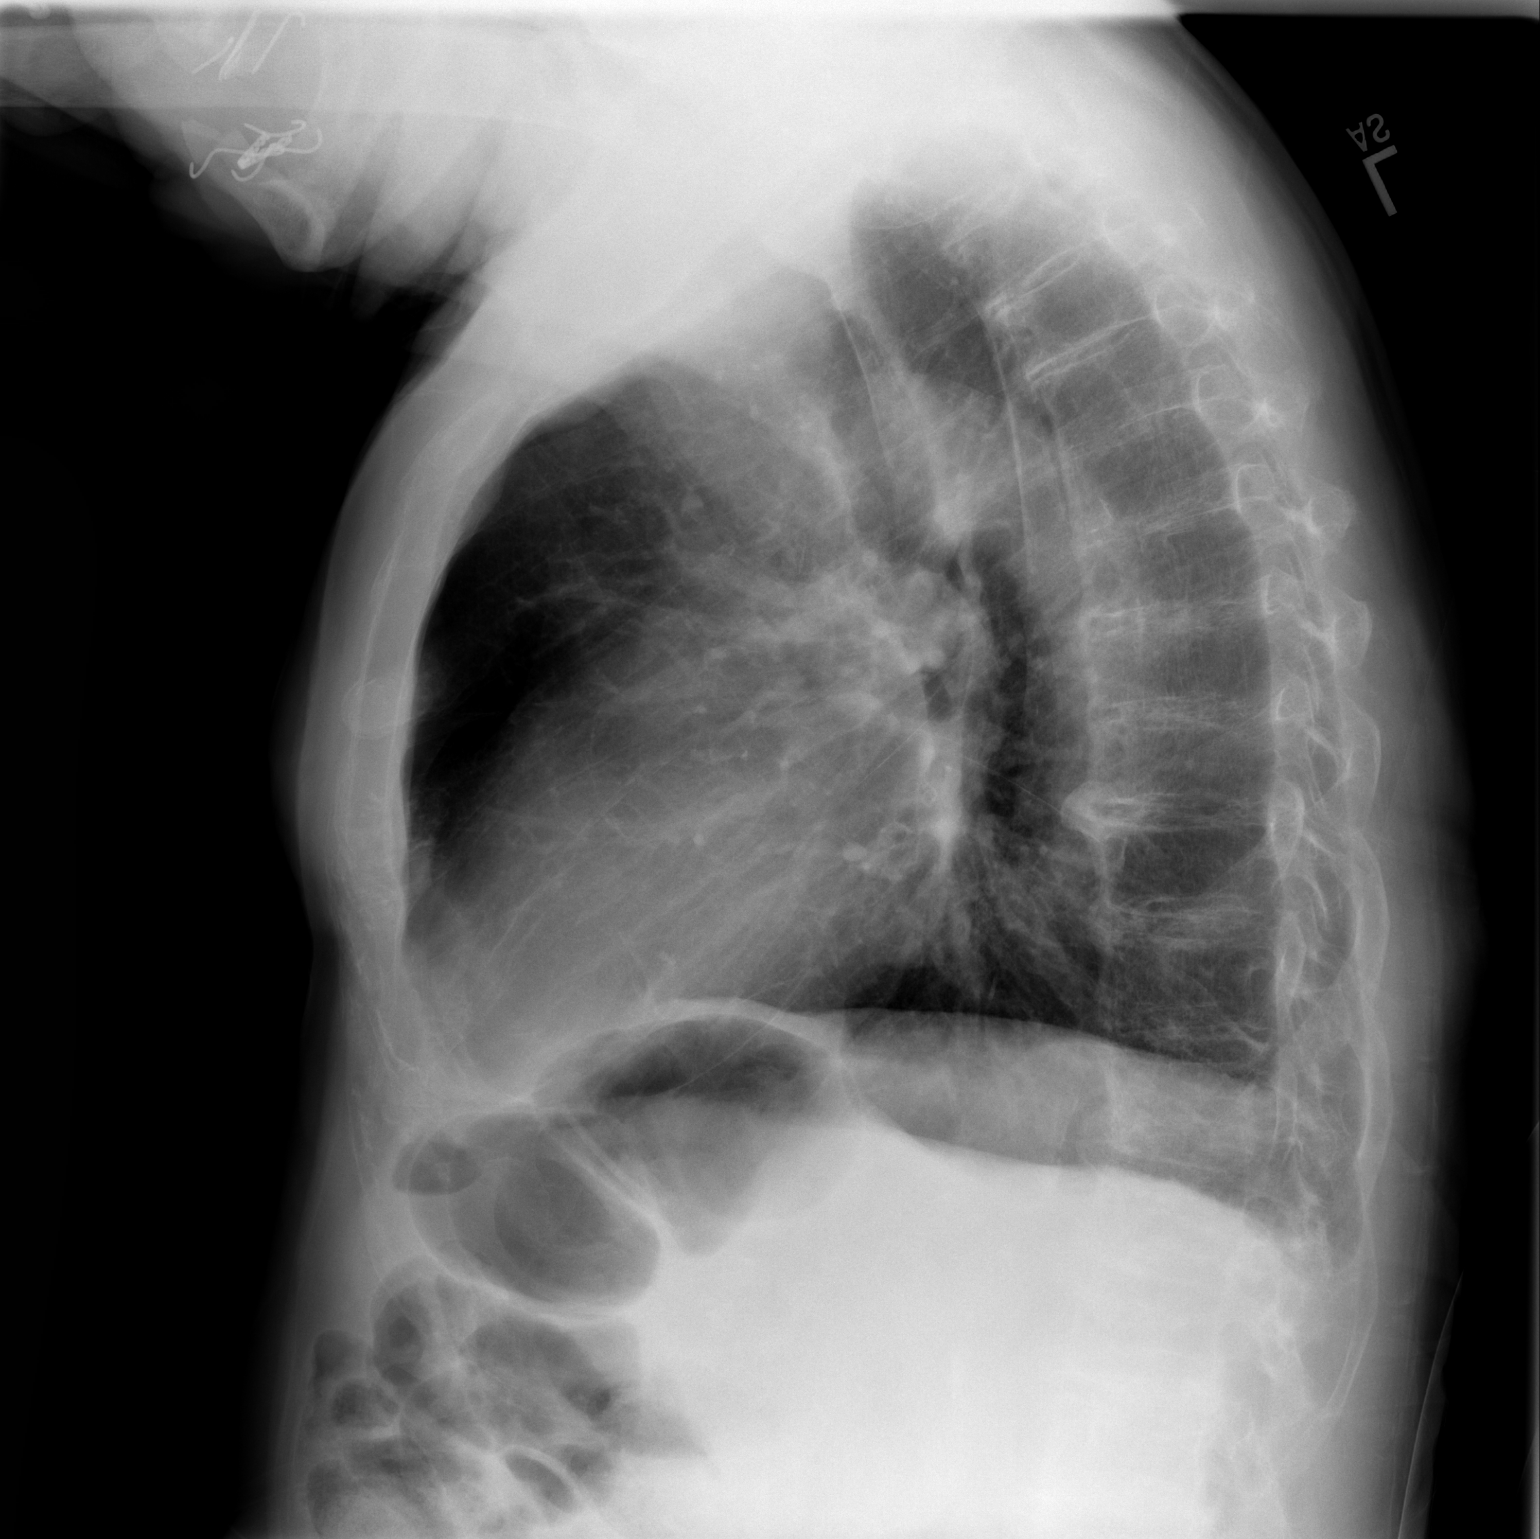

[w chest ap]
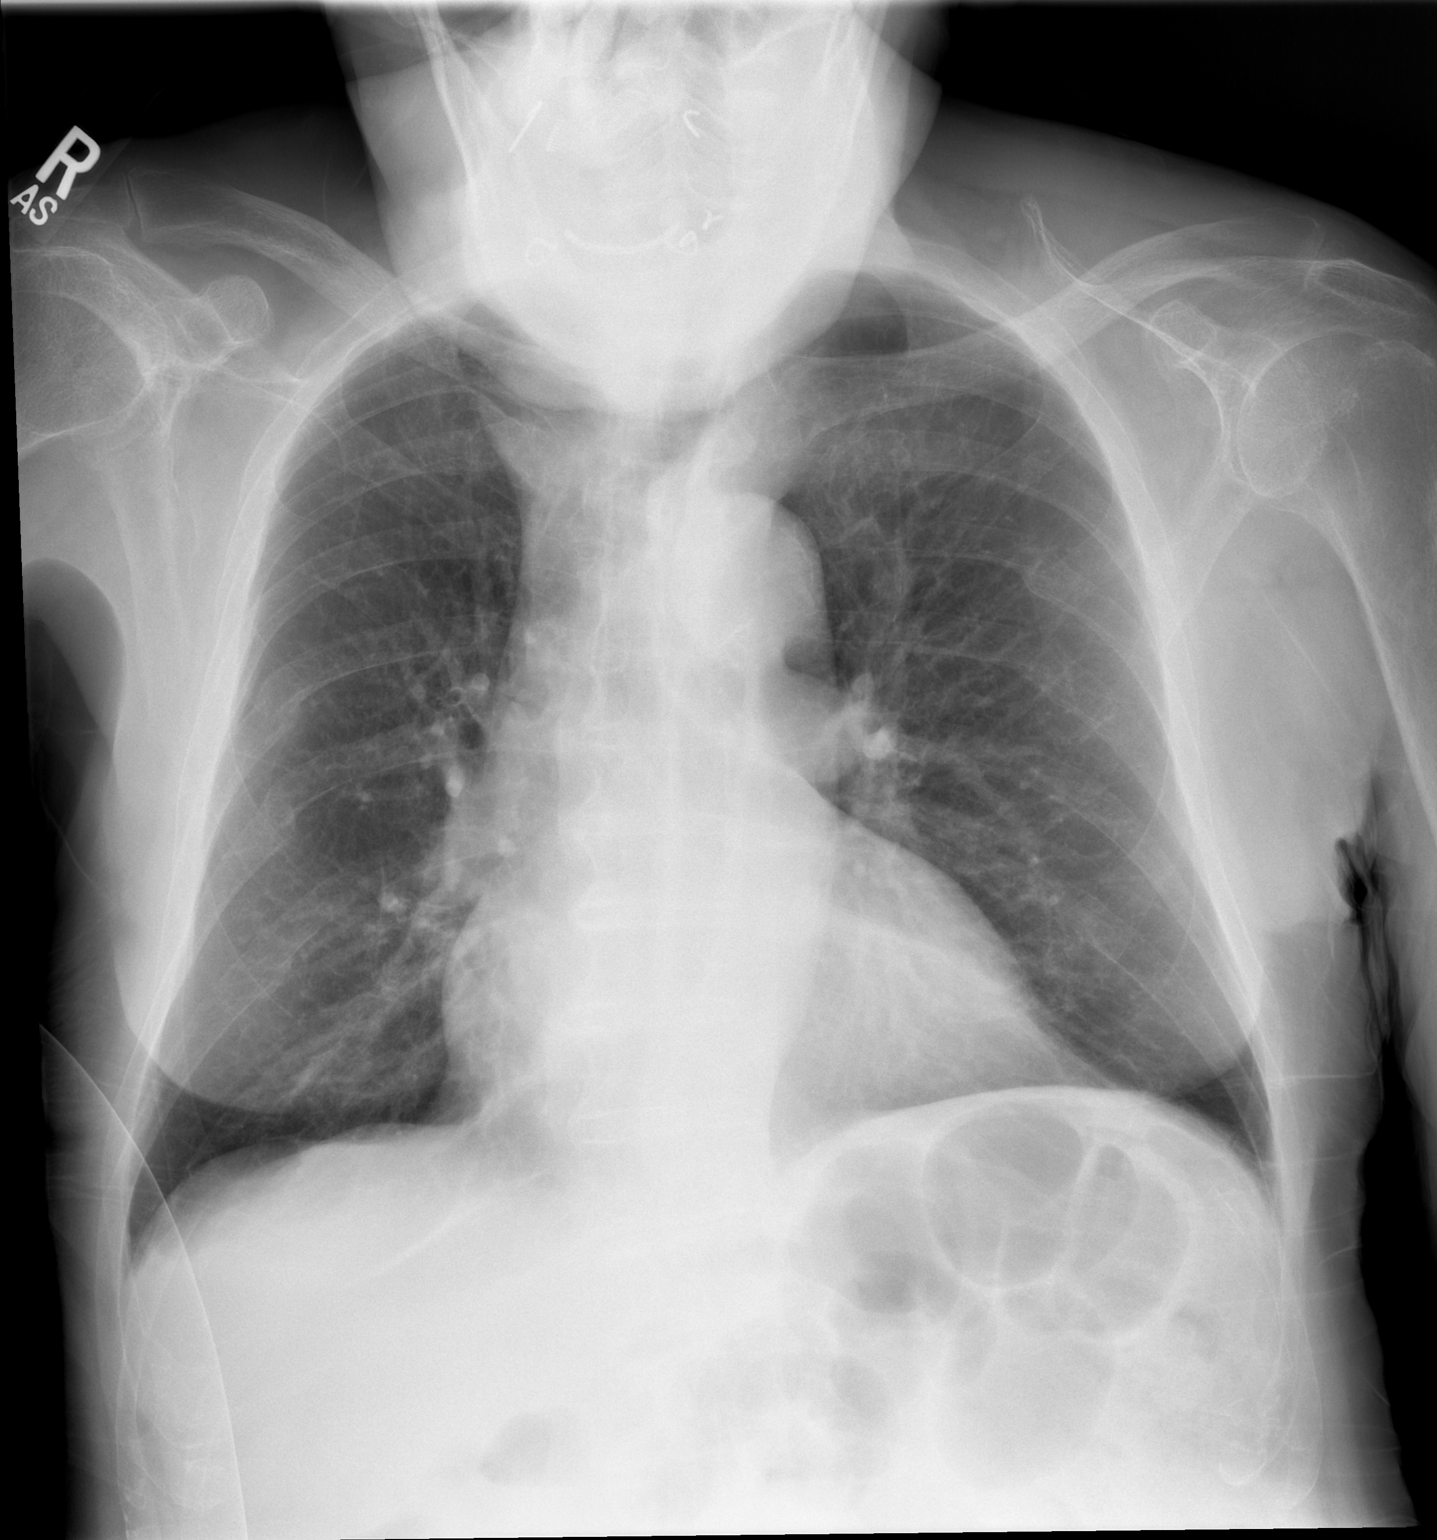

[2 of 2 positions shown; findings below may reference images not displayed]

FINDINGS: Cardiomediastinal silhouette is unremarkable. No acute infiltrate or
pleural effusion. No pulmonary edema. Osteopenia and mild
degenerative changes thoracic spine.
IMPRESSION: No active cardiopulmonary disease.

## 2014-09-16 ENCOUNTER — Other Ambulatory Visit: Payer: Self-pay | Admitting: *Deleted

## 2014-09-16 MED ORDER — GLUCOSE BLOOD VI STRP
ORAL_STRIP | Status: AC
Start: 2014-09-16 — End: ?

## 2014-09-16 MED ORDER — GLUCOSE BLOOD VI STRP: ORAL_STRIP | Status: DC

## 2014-09-16 NOTE — Telephone Encounter (Signed)
[facility] 

## 2014-09-16 NOTE — Telephone Encounter (Signed)
Pharmacy requested form filled out for testing strips. Filled out and given to Dr. Mariea Clonts to review and sign.

## 2014-09-27 ENCOUNTER — Telehealth: Payer: Self-pay | Admitting: *Deleted

## 2014-09-27 ENCOUNTER — Other Ambulatory Visit: Payer: Self-pay | Admitting: *Deleted

## 2014-09-27 MED ORDER — FREESTYLE LANCETS MISC: Status: AC

## 2014-09-27 NOTE — Telephone Encounter (Signed)
Filled out [employer]abetic [employer] Form and called [facility] # [phone]. Faxed to pharmacy

## 2014-09-27 NOTE — Telephone Encounter (Signed)
[employer]abetic [employer] form filled out and signed and faxed back to [facility]

## 2014-10-28 ENCOUNTER — Telehealth: Payer: Self-pay | Admitting: *Deleted

## 2014-10-28 NOTE — Telephone Encounter (Signed)
Nurse with Hospice called and stated that she suspects patient has pneumonia. Vital signs are--- 99.8, O2 92%, RR 26, pulse 103, BP 100/54. Unable to stand today. Food intake has dropped dramatically. Overall weak and lethargic and fine crackles to lungs. Cough sounding productive but he is too weak to mobilize secreations. Nurse has spoken with daughter and patient at length, they are requesting a antibiotic if available in liquid form since he is having difficulty swallowing. Not interested in O2 at this time. Please Advise.

## 2014-10-28 NOTE — Telephone Encounter (Signed)
Hospice nurse called and stated that since she hasn't heard back from [place] the Hospice Dr. Martin Majestic ahead and ordered an antibiotic for patient. Augmentin 250mg  by mouth every 8 hours for one week.

## 2014-11-11 DEATH — deceased
